# Patient Record
Sex: Female | Born: 1955 | Race: White | Hispanic: No | Marital: Single | Smoking: Never smoker
Health system: Southern US, Community
[De-identification: ages and names within clinical notes are randomized; demographics above are authoritative.]

## PROBLEM LIST (undated history)

## (undated) DIAGNOSIS — F329 Major depressive disorder, single episode, unspecified: Secondary | ICD-10-CM

## (undated) DIAGNOSIS — T7840XA Allergy, unspecified, initial encounter: Secondary | ICD-10-CM

## (undated) DIAGNOSIS — I10 Essential (primary) hypertension: Secondary | ICD-10-CM

## (undated) DIAGNOSIS — E785 Hyperlipidemia, unspecified: Secondary | ICD-10-CM

## (undated) DIAGNOSIS — B009 Herpesviral infection, unspecified: Secondary | ICD-10-CM

## (undated) DIAGNOSIS — Z87440 Personal history of urinary (tract) infections: Secondary | ICD-10-CM

## (undated) DIAGNOSIS — K635 Polyp of colon: Secondary | ICD-10-CM

## (undated) DIAGNOSIS — F32A Depression, unspecified: Secondary | ICD-10-CM

## (undated) HISTORY — DX: Personal history of urinary (tract) infections: Z87.440

## (undated) HISTORY — DX: Allergy, unspecified, initial encounter: T78.40XA

## (undated) HISTORY — DX: Major depressive disorder, single episode, unspecified: F32.9

## (undated) HISTORY — PX: OOPHORECTOMY: SHX86

## (undated) HISTORY — DX: Hyperlipidemia, unspecified: E78.5

## (undated) HISTORY — DX: Polyp of colon: K63.5

## (undated) HISTORY — PX: ABDOMINAL HYSTERECTOMY: SHX81

## (undated) HISTORY — DX: Essential (primary) hypertension: I10

## (undated) HISTORY — DX: Depression, unspecified: F32.A

## (undated) HISTORY — DX: Herpesviral infection, unspecified: B00.9

---
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
## 1996-08-15 HISTORY — PX: BACK SURGERY: SHX140

## 2011-07-19 DIAGNOSIS — I10 Essential (primary) hypertension: Secondary | ICD-10-CM | POA: Insufficient documentation

## 2013-02-26 ENCOUNTER — Ambulatory Visit (INDEPENDENT_AMBULATORY_CARE_PROVIDER_SITE_OTHER): Payer: BC Managed Care – HMO | Admitting: Internal Medicine

## 2013-02-26 ENCOUNTER — Encounter: Payer: Self-pay | Admitting: Internal Medicine

## 2013-02-26 VITALS — BP 110/70 | HR 78 | Temp 98.8°F | Ht 62.5 in | Wt 125.2 lb

## 2013-02-26 DIAGNOSIS — Z9109 Other allergy status, other than to drugs and biological substances: Secondary | ICD-10-CM

## 2013-02-26 DIAGNOSIS — F3289 Other specified depressive episodes: Secondary | ICD-10-CM

## 2013-02-26 DIAGNOSIS — Z86010 Personal history of colonic polyps: Secondary | ICD-10-CM

## 2013-02-26 DIAGNOSIS — F329 Major depressive disorder, single episode, unspecified: Secondary | ICD-10-CM

## 2013-02-26 DIAGNOSIS — F32A Depression, unspecified: Secondary | ICD-10-CM

## 2013-02-26 DIAGNOSIS — E7800 Pure hypercholesterolemia, unspecified: Secondary | ICD-10-CM

## 2013-02-26 DIAGNOSIS — I10 Essential (primary) hypertension: Secondary | ICD-10-CM

## 2013-03-02 ENCOUNTER — Encounter: Payer: Self-pay | Admitting: Internal Medicine

## 2013-03-02 DIAGNOSIS — Z860100 Personal history of colon polyps, unspecified: Secondary | ICD-10-CM | POA: Insufficient documentation

## 2013-03-02 DIAGNOSIS — I10 Essential (primary) hypertension: Secondary | ICD-10-CM | POA: Insufficient documentation

## 2013-03-02 DIAGNOSIS — F32A Depression, unspecified: Secondary | ICD-10-CM | POA: Insufficient documentation

## 2013-03-02 DIAGNOSIS — Z9109 Other allergy status, other than to drugs and biological substances: Secondary | ICD-10-CM | POA: Insufficient documentation

## 2013-03-02 DIAGNOSIS — E7800 Pure hypercholesterolemia, unspecified: Secondary | ICD-10-CM | POA: Insufficient documentation

## 2013-03-02 DIAGNOSIS — F329 Major depressive disorder, single episode, unspecified: Secondary | ICD-10-CM | POA: Insufficient documentation

## 2013-03-02 NOTE — Assessment & Plan Note (Signed)
Low cholesterol diet and exercise.  Follow lipid panel.   

## 2013-03-02 NOTE — Assessment & Plan Note (Signed)
Stable

## 2013-03-02 NOTE — Progress Notes (Signed)
  Subjective:    Patient ID: Brooke Gallagher, female    DOB: 22-May-1956, 57 y.o.   MRN: [id number]  HPI 57 year old female with past history of hypertension, hypercholesterolemia, depression and colonic polyps.  She comes in today to follow up on these issues as well as to establish care.  Has been seeing Dr Talmage Nap.  She has had some issues with increased blood pressure.  She has changed jobs.  Her blood pressure improved.  Previously on blood pressure medication.  Has been off since 3/14.  Blood pressures averaging 110-124/70-85.  Has also had a history of slightly increased cholesterol.  On no medication.  Was hospitalized in 2007 for depression.  Is followed by Dr Kristine Linea.  Is doing better now.  Controlled on trazodone and melatonin now.  Sees her every six months.  She is eating and drinking well.  Breathing stable.  Does report she has some bladder issues.  Some increased frequency.  No dysuria.  Does exercise regularly 3-5d/week.  Bowels stable.    Past Medical History  Diagnosis Date  . Depression   . Allergy   . Hyperlipidemia   . Hypertension   . Hx: UTI (urinary tract infection)   . Herpes   . Colon polyp     Outpatient Encounter Prescriptions as of 02/26/2013  Medication Sig Dispense Refill  . ibuprofen (ADVIL,MOTRIN) 100 MG tablet Take 100 mg by mouth every 6 (six) hours as needed for fever.      . loratadine (CLARITIN) 10 MG tablet Take 10 mg by mouth as needed for allergies.      . Melatonin 10 MG TABS Take by mouth daily.      . Prenatal Vit-Fe Fumarate-FA (M-VIT) tablet Take 1 tablet by mouth daily. Adult Formula Complete 50+      . traZODone (DESYREL) 100 MG tablet Take 100 mg by mouth at bedtime.      . valACYclovir (VALTREX) 500 MG tablet Take 500 mg by mouth as needed.       No facility-administered encounter medications on file as of 02/26/2013.    Review of Systems Patient denies any headache, lightheadedness or dizziness. No sinus or allergy symptoms.   No chest pain, tightness or palpitations.  No increased shortness of breath, cough or congestion.  No nausea or vomiting.  No acid reflux.  No abdominal pain or cramping.  No bowel change, such as diarrhea, constipation, BRBPR or melana.  Some urinary frequency.  No dysuria.  Depression is better.  Still follows by Dr Vanessa Ralphs.  Blood pressure as outlined.  Overall feels things are stable.         Objective:   Physical Exam Filed Vitals:   02/26/13 1001  BP: 110/70  Pulse: 78  Temp: 98.8 F (109.36 C)   57 year old female in no acute distress.   HEENT:  Nares- clear.  Oropharynx - without lesions. NECK:  Supple.  Nontender.  No audible bruit.  HEART:  Appears to be regular. LUNGS:  No crackles or wheezing audible.  Respirations even and unlabored.  RADIAL PULSE:  Equal bilaterally.   ABDOMEN:  Soft, nontender.  Bowel sounds present and normal.  No audible abdominal bruit.  EXTREMITIES:  No increased edema present.  DP pulses palpable and equal bilaterally.          Assessment & Plan:  HEALTH MAINTENANCE.  Schedule a physical next visit.  Schedule mammogram.  States last colonoscopy 2009.

## 2013-03-02 NOTE — Assessment & Plan Note (Signed)
Had a colonoscopy (per her report) 2009.  Recommended f/u in five years.

## 2013-03-02 NOTE — Assessment & Plan Note (Signed)
Stable currently.  On trazodone and melatonin.  Continue follow up with her psychiatrist.  Follow.

## 2013-03-02 NOTE — Assessment & Plan Note (Signed)
Blood pressure is doing well on no medication.  Follow.   

## 2013-03-21 ENCOUNTER — Ambulatory Visit: Payer: Self-pay | Admitting: Internal Medicine

## 2013-03-21 LAB — HM MAMMOGRAPHY

## 2013-03-26 ENCOUNTER — Encounter: Payer: Self-pay | Admitting: Internal Medicine

## 2013-04-03 ENCOUNTER — Encounter: Payer: Self-pay | Admitting: Internal Medicine

## 2013-05-03 ENCOUNTER — Encounter: Payer: Self-pay | Admitting: Internal Medicine

## 2013-05-03 ENCOUNTER — Other Ambulatory Visit (HOSPITAL_COMMUNITY)
Admission: RE | Admit: 2013-05-03 | Discharge: 2013-05-03 | Disposition: A | Discharge status: Home / Self Care | Payer: BC Managed Care – HMO | Source: Ambulatory Visit | Attending: Internal Medicine | Admitting: Internal Medicine

## 2013-05-03 ENCOUNTER — Ambulatory Visit (INDEPENDENT_AMBULATORY_CARE_PROVIDER_SITE_OTHER): Payer: BC Managed Care – HMO | Admitting: Internal Medicine

## 2013-05-03 VITALS — BP 122/82 | HR 69 | Temp 98.0°F | Ht 62.5 in | Wt 123.5 lb

## 2013-05-03 DIAGNOSIS — I10 Essential (primary) hypertension: Secondary | ICD-10-CM

## 2013-05-03 DIAGNOSIS — F329 Major depressive disorder, single episode, unspecified: Secondary | ICD-10-CM

## 2013-05-03 DIAGNOSIS — R87810 Cervical high risk human papillomavirus (HPV) DNA test positive: Secondary | ICD-10-CM | POA: Insufficient documentation

## 2013-05-03 DIAGNOSIS — Z1151 Encounter for screening for human papillomavirus (HPV): Secondary | ICD-10-CM | POA: Insufficient documentation

## 2013-05-03 DIAGNOSIS — Z86010 Personal history of colonic polyps: Secondary | ICD-10-CM

## 2013-05-03 DIAGNOSIS — Z124 Encounter for screening for malignant neoplasm of cervix: Secondary | ICD-10-CM

## 2013-05-03 DIAGNOSIS — F32A Depression, unspecified: Secondary | ICD-10-CM

## 2013-05-03 DIAGNOSIS — Z9109 Other allergy status, other than to drugs and biological substances: Secondary | ICD-10-CM

## 2013-05-03 DIAGNOSIS — E7800 Pure hypercholesterolemia, unspecified: Secondary | ICD-10-CM

## 2013-05-03 DIAGNOSIS — F3289 Other specified depressive episodes: Secondary | ICD-10-CM

## 2013-05-03 DIAGNOSIS — Z01419 Encounter for gynecological examination (general) (routine) without abnormal findings: Secondary | ICD-10-CM | POA: Insufficient documentation

## 2013-05-03 LAB — COMPREHENSIVE METABOLIC PANEL
ALT: 16 U/L (ref 0–35)
CO2: 28 mEq/L (ref 19–32)
Calcium: 9.3 mg/dL (ref 8.4–10.5)
Chloride: 104 mEq/L (ref 96–112)
GFR: 89.42 mL/min (ref >60.00)
Sodium: 138 mEq/L (ref 135–145)
Total Bilirubin: 0.8 mg/dL (ref 0.3–1.2)
Total Protein: 7.4 g/dL (ref 6.0–8.3)

## 2013-05-03 LAB — CBC WITH DIFFERENTIAL/PLATELET
Basophils Absolute: 0 10*3/uL (ref 0.0–0.1)
Lymphocytes Relative: 39.1 % (ref 12.0–46.0)
Monocytes Relative: 7.3 % (ref 3.0–12.0)
Platelets: 272 10*3/uL (ref 150.0–400.0)
RDW: 13.2 % (ref 11.5–14.6)

## 2013-05-03 LAB — LIPID PANEL: HDL: 44.6 mg/dL (ref >39.00)

## 2013-05-03 LAB — LDL CHOLESTEROL, DIRECT: Direct LDL: 146.7 mg/dL

## 2013-05-03 MED ORDER — VALACYCLOVIR HCL 500 MG PO TABS: 500.0000 mg | ORAL_TABLET | ORAL | Status: DC | PRN

## 2013-05-05 ENCOUNTER — Encounter: Payer: Self-pay | Admitting: Internal Medicine

## 2013-05-05 NOTE — Assessment & Plan Note (Signed)
Low cholesterol diet and exercise.  Follow lipid panel.   

## 2013-05-05 NOTE — Assessment & Plan Note (Addendum)
Last colonoscopy 2009.  Had two polyps.  Refer to GI for f/u colonoscopy.

## 2013-05-05 NOTE — Assessment & Plan Note (Signed)
Stable.  Follow.   

## 2013-05-05 NOTE — Progress Notes (Signed)
  Subjective:    Patient ID: Brooke Gallagher, female    DOB: 12/26/1955, 57 y.o.   MRN: [id number]  HPI 57 year old female with past history of hypertension, hypercholesterolemia, depression and colonic polyps.  She comes in today to follow up on these issues as well as for a complete physical exam.   She has had some issues with increased blood pressure.  She has changed jobs.  Her blood pressure improved.  Has also had a history of slightly increased cholesterol.  On no medication.  Was hospitalized in 2007 for depression.  Is followed by Dr Kristine Linea.  Is doing better now.  Controlled on trazodone and melatonin now.  Sees her every six months.  She is eating and drinking well.  Breathing stable.  Does exercise regularly 3-5d/week.  Reports had some stomach cramps two weeks ago.  Hard bowel movements.  Resolved now.     Past Medical History  Diagnosis Date  . Depression   . Allergy   . Hyperlipidemia   . Hypertension   . Hx: UTI (urinary tract infection)   . Herpes   . Colon polyp     Outpatient Encounter Prescriptions as of 05/03/2013  Medication Sig Dispense Refill  . ibuprofen (ADVIL,MOTRIN) 100 MG tablet Take 100 mg by mouth every 6 (six) hours as needed for fever.      . loratadine (CLARITIN) 10 MG tablet Take 10 mg by mouth as needed for allergies.      . Melatonin 10 MG TABS Take by mouth daily.      . Prenatal Vit-Fe Fumarate-FA (M-VIT) tablet Take 1 tablet by mouth daily. Adult Formula Complete 50+      . traZODone (DESYREL) 100 MG tablet Take 100 mg by mouth at bedtime.      . valACYclovir (VALTREX) 500 MG tablet Take 1 tablet (500 mg total) by mouth as needed.  30 tablet  1  . [DISCONTINUED] valACYclovir (VALTREX) 500 MG tablet Take 500 mg by mouth as needed.       No facility-administered encounter medications on file as of 05/03/2013.    Review of Systems Patient denies any headache, lightheadedness or dizziness. No sinus or allergy symptoms.  No chest pain, tightness or  palpitations.  No increased shortness of breath, cough or congestion.  No nausea or vomiting.  No acid reflux.  No abdominal pain or cramping.  No bowel change currently.  No diarrhea, constipation, BRBPR or melana.   Depression is better.  Still follows by Dr Vanessa Ralphs.  Blood pressure as outlined.  Overall feels things are stable.         Objective:   Physical Exam  Filed Vitals:   05/03/13 0901  BP: 122/82  Pulse: 69  Temp: 98 F (18.56 C)   57 year old female in no acute distress.   HEENT:  Nares- clear.  Oropharynx - without lesions. NECK:  Supple.  Nontender.  No audible bruit.  HEART:  Appears to be regular. LUNGS:  No crackles or wheezing audible.  Respirations even and unlabored.  RADIAL PULSE:  Equal bilaterally.   ABDOMEN:  Soft, nontender.  Bowel sounds present and normal.  No audible abdominal bruit.  EXTREMITIES:  No increased edema present.  DP pulses palpable and equal bilaterally.          Assessment & Plan:  HEALTH MAINTENANCE.  Physical today.   States last colonoscopy 2009.  Mammogram 03/21/13 - Birads II.

## 2013-05-05 NOTE — Assessment & Plan Note (Signed)
Blood pressure is doing well on no medication.  Follow.   

## 2013-05-05 NOTE — Assessment & Plan Note (Signed)
Stable

## 2013-05-06 ENCOUNTER — Encounter: Payer: Self-pay | Admitting: *Deleted

## 2013-05-08 ENCOUNTER — Other Ambulatory Visit: Payer: Self-pay | Admitting: Internal Medicine

## 2013-05-08 DIAGNOSIS — IMO0002 Reserved for concepts with insufficient information to code with codable children: Secondary | ICD-10-CM

## 2013-05-08 NOTE — Progress Notes (Signed)
Order placed for referral to gyn for abnormal pap smear.

## 2013-10-18 ENCOUNTER — Ambulatory Visit: Payer: BC Managed Care – HMO | Admitting: Internal Medicine

## 2014-03-26 ENCOUNTER — Encounter: Payer: Self-pay | Admitting: *Deleted

## 2014-03-26 ENCOUNTER — Ambulatory Visit: Payer: Self-pay | Admitting: Internal Medicine

## 2014-03-26 LAB — HM MAMMOGRAPHY: HM Mammogram: NEGATIVE

## 2014-04-11 ENCOUNTER — Encounter: Payer: Self-pay | Admitting: Internal Medicine

## 2015-12-07 ENCOUNTER — Encounter: Payer: Self-pay | Admitting: *Deleted

## 2015-12-11 ENCOUNTER — Encounter: Payer: Self-pay | Admitting: Obstetrics and Gynecology

## 2016-02-10 ENCOUNTER — Ambulatory Visit (INDEPENDENT_AMBULATORY_CARE_PROVIDER_SITE_OTHER): Payer: BLUE CROSS/BLUE SHIELD | Admitting: Obstetrics and Gynecology

## 2016-02-10 ENCOUNTER — Encounter: Payer: Self-pay | Admitting: Obstetrics and Gynecology

## 2016-02-10 VITALS — BP 128/94 | HR 78 | Ht 63.0 in | Wt 143.5 lb

## 2016-02-10 DIAGNOSIS — R635 Abnormal weight gain: Secondary | ICD-10-CM | POA: Diagnosis not present

## 2016-02-10 DIAGNOSIS — Z01419 Encounter for gynecological examination (general) (routine) without abnormal findings: Secondary | ICD-10-CM

## 2016-02-10 NOTE — Patient Instructions (Signed)
  Place annual gynecologic exam patient instructions here.  Thank you for enrolling in [facility]. Please follow the instructions below to securely access your online medical record. MyChart allows you to send messages to your doctor, view your test results, manage appointments, and more.   How Do I Sign Up? 1. In your Internet browser, go to [employer] and enter [email or web address]. 2. Click on the Sign Up Now link in the Sign In box. You will see the New Member Sign Up page. 3. Enter your MyChart Access Code exactly as it appears below. You will not need to use this code after you've completed the sign-up process. If you do not sign up before the expiration date, you must request a new code.  MyChart Access Code: [email or web address] Expires: 04/10/2016  9:10 AM  4. Enter your Social Security Number ([id number]) and Date of Birth (mm/dd/yyyy) as indicated and click Submit. You will be taken to the next sign-up page. 5. Create a MyChart ID. This will be your MyChart login ID and cannot be changed, so think of one that is secure and easy to remember. 6. Create a MyChart password. You can change your password at any time. 7. Enter your Password Reset Question and Answer. This can be used at a later time if you forget your password.  8. Enter your e-mail address. You will receive e-mail notification when new information is available in [place]. 9. Click Sign Up. You can now view your medical record.   Additional Information Remember, MyChart is NOT to be used for urgent needs. For medical emergencies, dial 911.

## 2016-02-10 NOTE — Progress Notes (Signed)
Subjective:   Brooke Gallagher is a 60 y.o. G2P0 Caucasian female here for a routine well-woman exam.  No LMP recorded. Patient is postmenopausal.    Current complaints: weight gain-slow with no real change in activity and eating habits PCP: C. 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  Past Medical History Past Medical History  Diagnosis Date  . Depression   . Allergy   . Hyperlipidemia   . Hypertension   . Hx: UTI (urinary tract infection)   . Herpes   . Colon polyp     Past Surgical History Past Surgical History  Procedure Laterality Date  . Tonsillectomy  1963  . Back surgery  1998    lower back    Gynecologic History G2P0  No LMP recorded. Patient is postmenopausal. Contraception: abstinence Last Pap: 2016. Results were: normal Last mammogram: 2016. Results were: normal   Obstetric History OB History  Gravida Para Term Preterm AB SAB TAB Ectopic Multiple Living  2         2    # Outcome Date GA Lbr Len/2nd Weight Sex Delivery Anes PTL Lv  2 Gravida      Vag-Spont   Y  1 Gravida      Vag-Spont   Y      Current Medications Current Outpatient Prescriptions on File Prior to Visit  Medication Sig Dispense Refill  . ibuprofen (ADVIL,MOTRIN) 100 MG tablet Take 100 mg by mouth every 6 (six) hours as needed for fever.    . loratadine (CLARITIN) 10 MG tablet Take 10 mg by mouth as needed for allergies.    . Melatonin 10 MG TABS Take by mouth daily.    . Prenatal Vit-Fe Fumarate-FA (M-VIT) tablet Take 1 tablet by mouth daily. Reported on 02/10/2016     No current  facility-administered medications on file prior to visit.    Review of Systems Patient denies any headaches, blurred vision, shortness of breath, chest pain, abdominal pain, problems with bowel movements, urination, or intercourse.  Objective:  BP 128/94 mmHg  Pulse 78  Ht 5\' 3"  (1.6 m)  Wt 143 lb 8 oz (65.091 kg)  BMI 25.43 kg/m2 Physical Exam  General:  Well developed, well nourished, no acute distress. She is alert and oriented x3. Skin:  Warm and dry Neck:  Midline trachea, no thyromegaly or nodules Cardiovascular: Regular rate and rhythm, no murmur heard Lungs:  Effort normal, all lung fields clear to auscultation bilaterally Breasts:  No dominant palpable mass, retraction, or nipple discharge Abdomen:  Soft, non tender, no hepatosplenomegaly or masses Pelvic:  External genitalia is atrophic in appearance.  The vagina is normal and atrophic in appearance. The cervix is bulbous, no CMT.  Thin prep pap is not done . Uterus is felt to be normal size, shape, and contour.  No adnexal masses or tenderness noted.  Extremities:  No swelling or varicosities noted Psych:  She has a normal mood and affect  Assessment:   Healthy well-woman exam htn Weigh gain  Plan:  Labs obtained, discussed changes in exercise and nutrition, water intake To check BP at home and if diastolic  stays over 90 to call to restart BP meds. F/U 1 year for AE, or sooner if needed Mammogram scheduled.  Landen Breeland Rockney Ghee, CNM

## 2016-02-11 LAB — COMPREHENSIVE METABOLIC PANEL
ALT: 22 IU/L (ref 0–32)
AST: 23 IU/L (ref 0–40)
Albumin/Globulin Ratio: 1.8 (ref 1.2–2.2)
Albumin: 4.4 g/dL (ref 3.6–4.8)
Alkaline Phosphatase: 57 IU/L (ref 39–117)
BUN/Creatinine Ratio: 18 (ref 12–28)
BUN: 12 mg/dL (ref 8–27)
Bilirubin Total: 0.3 mg/dL (ref 0.0–1.2)
CALCIUM: 9.6 mg/dL (ref 8.7–10.3)
CO2: 25 mmol/L (ref 18–29)
CREATININE: 0.66 mg/dL (ref 0.57–1.00)
Chloride: 101 mmol/L (ref 96–106)
GFR, EST AFRICAN AMERICAN: 109 mL/min/{1.73_m2} (ref >59)
GFR, EST NON AFRICAN AMERICAN: 95 mL/min/{1.73_m2} (ref >59)
GLOBULIN, TOTAL: 2.4 g/dL (ref 1.5–4.5)
Glucose: 71 mg/dL (ref 65–99)
Potassium: 4.2 mmol/L (ref 3.5–5.2)
SODIUM: 141 mmol/L (ref 134–144)
TOTAL PROTEIN: 6.8 g/dL (ref 6.0–8.5)

## 2016-02-11 LAB — TSH: TSH: 3.84 u[IU]/mL (ref 0.450–4.500)

## 2016-02-11 LAB — LIPID PANEL
CHOLESTEROL TOTAL: 177 mg/dL (ref 100–199)
Chol/HDL Ratio: 4.4 ratio units (ref 0.0–4.4)
HDL: 40 mg/dL (ref >39)
LDL CALC: 110 mg/dL — AB (ref 0–99)
Triglycerides: 136 mg/dL (ref 0–149)
VLDL Cholesterol Cal: 27 mg/dL (ref 5–40)

## 2016-02-11 LAB — VITAMIN D 25 HYDROXY (VIT D DEFICIENCY, FRACTURES): VIT D 25 HYDROXY: 33.2 ng/mL (ref 30.0–100.0)

## 2016-03-22 ENCOUNTER — Ambulatory Visit
Admission: RE | Admit: 2016-03-22 | Discharge: 2016-03-22 | Disposition: A | Discharge status: Home / Self Care | Payer: BLUE CROSS/BLUE SHIELD | Source: Ambulatory Visit | Attending: Obstetrics and Gynecology | Admitting: Obstetrics and Gynecology

## 2016-03-22 ENCOUNTER — Other Ambulatory Visit: Payer: Self-pay | Admitting: Obstetrics and Gynecology

## 2016-03-22 DIAGNOSIS — Z01419 Encounter for gynecological examination (general) (routine) without abnormal findings: Secondary | ICD-10-CM | POA: Diagnosis not present

## 2016-03-22 DIAGNOSIS — Z1231 Encounter for screening mammogram for malignant neoplasm of breast: Secondary | ICD-10-CM | POA: Diagnosis present

## 2016-03-29 ENCOUNTER — Telehealth: Payer: Self-pay | Admitting: *Deleted

## 2016-03-29 NOTE — Telephone Encounter (Signed)
Patient has not seen Dr. Nicki Reaper since 2014, is it okay to re-establish pt with Dr. Nicki Reaper

## 2016-03-30 NOTE — Telephone Encounter (Signed)
Pt wants to reestablish care.  I do not mind her reestablishing care.  Need to know if there was problem initially (why did she stop coming).

## 2016-03-30 NOTE — Telephone Encounter (Signed)
Patient can re-establish, when you schedule can you ask her if there was an issue prior?  Thanks

## 2016-03-30 NOTE — Telephone Encounter (Signed)
LVM for pt to call to schedule

## 2016-06-22 ENCOUNTER — Encounter: Payer: Self-pay | Admitting: Internal Medicine

## 2016-06-22 ENCOUNTER — Ambulatory Visit (INDEPENDENT_AMBULATORY_CARE_PROVIDER_SITE_OTHER): Payer: BLUE CROSS/BLUE SHIELD | Admitting: Internal Medicine

## 2016-06-22 VITALS — BP 140/90 | HR 78 | Temp 98.1°F | Ht 63.0 in | Wt 143.4 lb

## 2016-06-22 DIAGNOSIS — E7800 Pure hypercholesterolemia, unspecified: Secondary | ICD-10-CM | POA: Diagnosis not present

## 2016-06-22 DIAGNOSIS — Z86010 Personal history of colonic polyps: Secondary | ICD-10-CM

## 2016-06-22 DIAGNOSIS — R079 Chest pain, unspecified: Secondary | ICD-10-CM | POA: Diagnosis not present

## 2016-06-22 DIAGNOSIS — I10 Essential (primary) hypertension: Secondary | ICD-10-CM | POA: Diagnosis not present

## 2016-06-22 DIAGNOSIS — Z0000 Encounter for general adult medical examination without abnormal findings: Secondary | ICD-10-CM

## 2016-06-22 MED ORDER — LISINOPRIL 10 MG PO TABS: 10.0000 mg | ORAL_TABLET | Freq: Every day | ORAL | 1 refills | Status: DC

## 2016-06-22 NOTE — Assessment & Plan Note (Signed)
Blood pressure has been a little elevated.  Given persistent elevation, will start lisinopril 10mg  q day.  Will check metabolic panel in 123456 days to confirm kidney function stable.  Get her back in soon to reassess.

## 2016-06-22 NOTE — Progress Notes (Signed)
Pre visit review using our clinic review tool, if applicable. No additional management support is needed unless otherwise documented below in the visit note. 

## 2016-06-22 NOTE — Progress Notes (Signed)
Patient ID: Brooke Gallagher, female   DOB: [birth year], 60 y.o.   MRN: [id number]   Subjective:    Patient ID: Brooke Gallagher, female    DOB: [birth year], 60 y.o.   MRN: [id number]  HPI  Patient here for a scheduled follow up .  I have not seen her since 2014.  She is up to date with gyn exams.  Sees [facility].  Just evaluated in June.  Blood pressure elevated at that visit.  She has been monitoring her blood pressure.  Outside checks averaging 120-140s/80-90s.  See attached list.  She reports she had some chest pain recently.  Works as a Surveyor, minerals.  States she had fixed lunch and had eaten lunch.  Noticed a burning chest pain.  Had to sit down.  States the symptoms were intermittent (coming and going) for approximately two hours.  Reports no history of acid reflux.  No dysphagia.  Tries to stay active.  No nausea or vomiting.  Bowels stable.  Had not had reoccurrence.  Just had labs through gyn.  Discussed with her today.     Past Medical History:  Diagnosis Date  . Allergy   . Colon polyp   . Depression   . Herpes   . Hx: UTI (urinary tract infection)   . Hyperlipidemia   . Hypertension    Past Surgical History:  Procedure Laterality Date  . [place]   lower back  . TONSILLECTOMY  1963   Family History  Problem Relation Age of Onset  . Arthritis Mother   . Stroke Mother   . Hypertension Mother   . Alzheimer's disease Mother   . Colon cancer Father   . Prostate cancer Father   . Mental illness Sister   . Alcohol abuse Brother   . Colon cancer Paternal Uncle   . Colon cancer Maternal Grandmother   . Mental illness Maternal Grandmother   . Alzheimer's disease Maternal Grandmother   . Hyperlipidemia Maternal Grandfather   . Hypertension Maternal Grandfather   . Diabetes Maternal Grandfather   . Arthritis Paternal Grandmother   . Alcohol abuse Paternal Grandfather   . Breast cancer Neg Hx    Social History   Social History  . Marital status: Single    Spouse  name: N/A  . Number of children: N/A  . Years of education: N/A   Social History Main Topics  . Smoking status: Never Smoker  . Smokeless tobacco: Never Used  . Alcohol use Yes  . Drug use: No  . Sexual activity: Not Currently   Other Topics Concern  . None   Social History Narrative  . None    Outpatient Encounter Prescriptions as of 06/22/2016  Medication Sig  . ibuprofen (ADVIL,MOTRIN) 100 MG tablet Take 100 mg by mouth every 6 (six) hours as needed for fever.  . loratadine (CLARITIN) 10 MG tablet Take 10 mg by mouth as needed for allergies.  . Melatonin 10 MG TABS Take by mouth daily.  . Prenatal Vit-Fe Fumarate-FA (M-VIT) tablet Take 1 tablet by mouth daily. Reported on 02/10/2016  . lisinopril (PRINIVIL,ZESTRIL) 10 MG tablet Take 1 tablet (10 mg total) by mouth daily.   No facility-administered encounter medications on file as of 06/22/2016.     Review of Systems  Constitutional: Negative for appetite change and unexpected weight change.  HENT: Negative for congestion and sinus pressure.   Eyes: Negative for pain and visual disturbance.  Respiratory: Negative for cough, chest tightness and  shortness of breath.   Cardiovascular: Positive for chest pain. Negative for palpitations and leg swelling.  Gastrointestinal: Negative for abdominal pain, diarrhea, nausea and vomiting.  Genitourinary: Negative for difficulty urinating and dysuria.  Musculoskeletal: Negative for back pain and joint swelling.  Skin: Negative for color change and rash.  Neurological: Negative for dizziness, light-headedness and headaches.  Hematological: Negative for adenopathy. Does not bruise/bleed easily.  Psychiatric/Behavioral: Negative for agitation and dysphoric mood.       Objective:     Blood pressure rechecked by me:  136/88-90  Physical Exam  Constitutional: She appears well-developed and well-nourished. No distress.  HENT:  Nose: Nose normal.  Mouth/Throat: Oropharynx is clear and  moist.  Neck: Neck supple. No thyromegaly present.  Cardiovascular: Normal rate and regular rhythm.   Pulmonary/Chest: Breath sounds normal. No respiratory distress. She has no wheezes.  Abdominal: Soft. Bowel sounds are normal. There is no tenderness.  Musculoskeletal: She exhibits no edema or tenderness.  Lymphadenopathy:    She has no cervical adenopathy.  Skin: No rash noted. No erythema.  Psychiatric: She has a normal mood and affect. Her behavior is normal.    BP 140/90   Pulse 78   Temp 98.1 F (36.7 C) (Oral)   Ht 5\' 3"  (1.6 m)   Wt 143 lb 6.4 oz (65 kg)   SpO2 99%   BMI 25.40 kg/m  Wt Readings from Last 3 Encounters:  06/22/16 143 lb 6.4 oz (65 kg)  02/10/16 143 lb 8 oz (65.1 kg)  05/03/13 123 lb 8 oz (56 kg)     Lab Results  Component Value Date   WBC 4.3 (L) 05/03/2013   HGB 13.8 05/03/2013   HCT 40.8 05/03/2013   PLT 272.0 05/03/2013   GLUCOSE 71 02/10/2016   CHOL 177 02/10/2016   TRIG 136 02/10/2016   HDL 40 02/10/2016   LDLDIRECT 146.7 05/03/2013   LDLCALC 110 (H) 02/10/2016   ALT 22 02/10/2016   AST 23 02/10/2016   NA 141 02/10/2016   K 4.2 02/10/2016   CL 101 02/10/2016   CREATININE 0.66 02/10/2016   BUN 12 02/10/2016   CO2 25 02/10/2016   TSH 3.840 02/10/2016    Mm Screening Breast Tomo Bilateral  Result Date: 03/22/2016 CLINICAL DATA:  Screening. EXAM: 2D DIGITAL SCREENING BILATERAL MAMMOGRAM WITH CAD AND ADJUNCT TOMO COMPARISON:  Previous exam(s). ACR Breast Density Category b: There are scattered areas of fibroglandular density. FINDINGS: There are no findings suspicious for malignancy. Images were processed with CAD. IMPRESSION: No mammographic evidence of malignancy. A result letter of this screening mammogram will be mailed directly to the patient. RECOMMENDATION: Screening mammogram in one year. (Code:SM-B-01Y) BI-RADS CATEGORY  1: Negative. Electronically Signed   By: Altamese Cabal M.D.   On: 03/22/2016 12:41       Assessment &  Plan:   Problem List Items Addressed This Visit    Chest pain - Primary    Had the episode of chest pain as outlined.  Intermittent for approximately 2 hours.  EKG obtained and reveals SR with no acute ischemic changes.  Will treat blood pressure as outlined.  Given chest pain lasting intermittently for two hours, would like for cardiology to evaluate with question of need for any further testing or w/up.  She is in agreement.        Relevant Orders   EKG 12-Lead (Completed)   Ambulatory referral to Cardiology   Essential hypertension, benign    Blood pressure has been  a little elevated.  Given persistent elevation, will start lisinopril 10mg  q day.  Will check metabolic panel in 123456 days to confirm kidney function stable.  Get her back in soon to reassess.        Relevant Medications   lisinopril (PRINIVIL,ZESTRIL) 10 MG tablet   Other Relevant Orders   Basic metabolic panel   Healthcare maintenance    Her her physical through gyn 01/2016.  Mammogram 03/22/16 - Birads I.  Refer for colonoscopy as outlined.        History of colonic polyps    Last colonoscopy 2009.  Had polyps.  Refer to GI for evaluation for colonoscopy.        Relevant Orders   Ambulatory referral to Gastroenterology   Hypercholesterolemia    Had recent labs through gyn 01/2016.  LDL 110.  Continue low cholesterol diet and exercise.  Follow.       Relevant Medications   lisinopril (PRINIVIL,ZESTRIL) 10 MG tablet       Einar Pheasant, MD

## 2016-06-22 NOTE — Assessment & Plan Note (Signed)
Last colonoscopy 2009.  Had polyps.  Refer to GI for evaluation for colonoscopy.

## 2016-06-23 ENCOUNTER — Encounter: Payer: Self-pay | Admitting: Internal Medicine

## 2016-06-23 DIAGNOSIS — R079 Chest pain, unspecified: Secondary | ICD-10-CM | POA: Insufficient documentation

## 2016-06-23 DIAGNOSIS — Z0000 Encounter for general adult medical examination without abnormal findings: Secondary | ICD-10-CM | POA: Insufficient documentation

## 2016-06-23 NOTE — Assessment & Plan Note (Signed)
Had the episode of chest pain as outlined.  Intermittent for approximately 2 hours.  EKG obtained and reveals SR with no acute ischemic changes.  Will treat blood pressure as outlined.  Given chest pain lasting intermittently for two hours, would like for cardiology to evaluate with question of need for any further testing or w/up.  She is in agreement.

## 2016-06-23 NOTE — Assessment & Plan Note (Signed)
Her her physical through gyn 01/2016.  Mammogram 03/22/16 - Birads I.  Refer for colonoscopy as outlined.

## 2016-06-23 NOTE — Assessment & Plan Note (Signed)
Had recent labs through gyn 01/2016.  LDL 110.  Continue low cholesterol diet and exercise.  Follow.

## 2016-06-29 ENCOUNTER — Ambulatory Visit: Payer: BLUE CROSS/BLUE SHIELD | Admitting: Cardiology

## 2016-06-30 ENCOUNTER — Other Ambulatory Visit (INDEPENDENT_AMBULATORY_CARE_PROVIDER_SITE_OTHER): Payer: BLUE CROSS/BLUE SHIELD

## 2016-06-30 ENCOUNTER — Encounter: Payer: Self-pay | Admitting: Internal Medicine

## 2016-06-30 DIAGNOSIS — I10 Essential (primary) hypertension: Secondary | ICD-10-CM

## 2016-06-30 LAB — BASIC METABOLIC PANEL
BUN: 14 mg/dL (ref 6–23)
CALCIUM: 9.6 mg/dL (ref 8.4–10.5)
CHLORIDE: 106 meq/L (ref 96–112)
CO2: 28 mEq/L (ref 19–32)
CREATININE: 0.72 mg/dL (ref 0.40–1.20)
GFR: 87.07 mL/min (ref >60.00)
GLUCOSE: 67 mg/dL — AB (ref 70–99)
Potassium: 4 mEq/L (ref 3.5–5.1)
Sodium: 139 mEq/L (ref 135–145)

## 2016-07-01 ENCOUNTER — Encounter: Payer: Self-pay | Admitting: Internal Medicine

## 2016-08-30 ENCOUNTER — Ambulatory Visit (INDEPENDENT_AMBULATORY_CARE_PROVIDER_SITE_OTHER): Payer: BLUE CROSS/BLUE SHIELD | Admitting: Internal Medicine

## 2016-08-30 ENCOUNTER — Encounter: Payer: Self-pay | Admitting: Internal Medicine

## 2016-08-30 DIAGNOSIS — I10 Essential (primary) hypertension: Secondary | ICD-10-CM | POA: Diagnosis not present

## 2016-08-30 DIAGNOSIS — Z86010 Personal history of colonic polyps: Secondary | ICD-10-CM

## 2016-08-30 DIAGNOSIS — E7800 Pure hypercholesterolemia, unspecified: Secondary | ICD-10-CM | POA: Diagnosis not present

## 2016-08-30 DIAGNOSIS — R079 Chest pain, unspecified: Secondary | ICD-10-CM

## 2016-08-30 NOTE — Assessment & Plan Note (Signed)
LDL on last check 110.  She is adjusting her diet and exercising.  Follow lipid panel.

## 2016-08-30 NOTE — Patient Instructions (Signed)
Remain off lisinopril.  Follow blood pressures.  Send in readings over the next few weeks.

## 2016-08-30 NOTE — Assessment & Plan Note (Signed)
Blood pressure doing better.  Outside readings reviewed.  She has been off her lisinopril for the last couple of days.  Blood pressure looks good.  She has adjusted her diet.  Is exercising.  Feels good.  Will remain off.  Follow pressures.  Send in readings over the next few weeks.

## 2016-08-30 NOTE — Progress Notes (Signed)
Patient ID: Brooke Gallagher, female   DOB: Oct 24, 1955, 61 y.o.   MRN: [id number]   Subjective:    Patient ID: Brooke Gallagher, female    DOB: 11-24-1955, 62 y.o.   MRN: [id number]  HPI  Patient here for a scheduled follow up.  She is feeling better.  Last visit, we placed her on lisinopril for elevated blood pressure.  She has adjusted her diet.  Is exercising.  Blood pressure is better.  Overall trending down.  See attached list.  She cancelled her appt with cardiology.  States she did not feel she needed.  The previous episode occurred after eating a food she usually does not eat.  Has had no other episodes.  No chest pain or sob with exercise.  Feels better with exercise.  Desires no further testing.  No abdominal pain.  No nausea or vomiting.  Bowels stable.     Past Medical History:  Diagnosis Date  . Allergy   . Colon polyp   . Depression   . Herpes   . Hx: UTI (urinary tract infection)   . Hyperlipidemia   . Hypertension    Past Surgical History:  Procedure Laterality Date  . [place]   lower back  . TONSILLECTOMY  1963   Family History  Problem Relation Age of Onset  . Arthritis Mother   . Stroke Mother   . Hypertension Mother   . Alzheimer's disease Mother   . Colon cancer Father   . Prostate cancer Father   . Mental illness Sister   . Alcohol abuse Brother   . Colon cancer Paternal Uncle   . Colon cancer Maternal Grandmother   . Mental illness Maternal Grandmother   . Alzheimer's disease Maternal Grandmother   . Hyperlipidemia Maternal Grandfather   . Hypertension Maternal Grandfather   . Diabetes Maternal Grandfather   . Arthritis Paternal Grandmother   . Alcohol abuse Paternal Grandfather   . Breast cancer Neg Hx    Social History   Social History  . Marital status: Single    Spouse name: N/A  . Number of children: N/A  . Years of education: N/A   Social History Main Topics  . Smoking status: Never Smoker  . Smokeless tobacco: Never Used    . Alcohol use Yes  . Drug use: No  . Sexual activity: Not Currently   Other Topics Concern  . None   Social History Narrative  . None    Outpatient Encounter Prescriptions as of 08/30/2016  Medication Sig  . ibuprofen (ADVIL,MOTRIN) 100 MG tablet Take 100 mg by mouth every 6 (six) hours as needed for fever.  . loratadine (CLARITIN) 10 MG tablet Take 10 mg by mouth as needed for allergies.  . Melatonin 10 MG TABS Take by mouth daily.  . Multiple Vitamin (MULTIVITAMIN) tablet Take 1 tablet by mouth daily.  . [DISCONTINUED] lisinopril (PRINIVIL,ZESTRIL) 10 MG tablet Take 1 tablet (10 mg total) by mouth daily.  . [DISCONTINUED] Prenatal Vit-Fe Fumarate-FA (M-VIT) tablet Take 1 tablet by mouth daily. Reported on 02/10/2016   No facility-administered encounter medications on file as of 08/30/2016.     Review of Systems  Constitutional: Negative for appetite change and unexpected weight change.  HENT: Negative for congestion and sinus pressure.   Respiratory: Negative for cough, chest tightness and shortness of breath.   Cardiovascular: Negative for chest pain, palpitations and leg swelling.  Gastrointestinal: Negative for abdominal pain, diarrhea, nausea and vomiting.  Genitourinary: Negative  for difficulty urinating and dysuria.  Musculoskeletal: Negative for back pain and joint swelling.  Skin: Negative for color change and rash.  Neurological: Negative for dizziness, light-headedness and headaches.  Psychiatric/Behavioral: Negative for agitation and dysphoric mood.       Objective:     Blood pressure rechecked by me:  110/72  Physical Exam  Constitutional: She appears well-developed and well-nourished. No distress.  HENT:  Nose: Nose normal.  Mouth/Throat: Oropharynx is clear and moist.  Neck: Neck supple. No thyromegaly present.  Cardiovascular: Normal rate and regular rhythm.   Pulmonary/Chest: Breath sounds normal. No respiratory distress. She has no wheezes.   Abdominal: Soft. Bowel sounds are normal. There is no tenderness.  Musculoskeletal: She exhibits no edema or tenderness.  Lymphadenopathy:    She has no cervical adenopathy.  Skin: No rash noted. No erythema.  Psychiatric: She has a normal mood and affect. Her behavior is normal.    BP 124/78   Pulse 82   Temp 97.5 F (36.4 C) (Oral)   Ht 5\' 3"  (1.6 m)   Wt 143 lb 6.4 oz (65 kg)   SpO2 97%   BMI 25.40 kg/m  Wt Readings from Last 3 Encounters:  08/30/16 143 lb 6.4 oz (65 kg)  06/22/16 143 lb 6.4 oz (65 kg)  02/10/16 143 lb 8 oz (65.1 kg)     Lab Results  Component Value Date   WBC 4.3 (L) 05/03/2013   HGB 13.8 05/03/2013   HCT 40.8 05/03/2013   PLT 272.0 05/03/2013   GLUCOSE 67 (L) 06/30/2016   CHOL 177 02/10/2016   TRIG 136 02/10/2016   HDL 40 02/10/2016   LDLDIRECT 146.7 05/03/2013   LDLCALC 110 (H) 02/10/2016   ALT 22 02/10/2016   AST 23 02/10/2016   NA 139 06/30/2016   K 4.0 06/30/2016   CL 106 06/30/2016   CREATININE 0.72 06/30/2016   BUN 14 06/30/2016   CO2 28 06/30/2016   TSH 3.840 02/10/2016    Mm Screening Breast Tomo Bilateral  Result Date: 03/22/2016 CLINICAL DATA:  Screening. EXAM: 2D DIGITAL SCREENING BILATERAL MAMMOGRAM WITH CAD AND ADJUNCT TOMO COMPARISON:  Previous exam(s). ACR Breast Density Category b: There are scattered areas of fibroglandular density. FINDINGS: There are no findings suspicious for malignancy. Images were processed with CAD. IMPRESSION: No mammographic evidence of malignancy. A result letter of this screening mammogram will be mailed directly to the patient. RECOMMENDATION: Screening mammogram in one year. (Code:SM-B-01Y) BI-RADS CATEGORY  1: Negative. Electronically Signed   By: Altamese Cabal M.D.   On: 03/22/2016 12:41       Assessment & Plan:   Problem List Items Addressed This Visit    Chest pain    No chest pain since last visit.  The previous episode occurred when she ate a different food.  Is exercising and no  pain.  Feels better with exercise.  She declines any further w/up at this time.  Follow.       Essential hypertension, benign    Blood pressure doing better.  Outside readings reviewed.  She has been off her lisinopril for the last couple of days.  Blood pressure looks good.  She has adjusted her diet.  Is exercising.  Feels good.  Will remain off.  Follow pressures.  Send in readings over the next few weeks.        History of colonic polyps    Last colonoscopy 2009.  Scheduled to see GI.  Hypercholesterolemia    LDL on last check 110.  She is adjusting her diet and exercising.  Follow lipid panel.            Einar Pheasant, MD

## 2016-08-30 NOTE — Assessment & Plan Note (Signed)
No chest pain since last visit.  The previous episode occurred when she ate a different food.  Is exercising and no pain.  Feels better with exercise.  She declines any further w/up at this time.  Follow.

## 2016-08-30 NOTE — Assessment & Plan Note (Signed)
Last colonoscopy 2009.  Scheduled to see GI.

## 2016-08-30 NOTE — Progress Notes (Signed)
Pre visit review using our clinic review tool, if applicable. No additional management support is needed unless otherwise documented below in the visit note. 

## 2016-10-13 DIAGNOSIS — K5730 Diverticulosis of large intestine without perforation or abscess without bleeding: Secondary | ICD-10-CM | POA: Insufficient documentation

## 2016-10-19 ENCOUNTER — Encounter: Payer: Self-pay | Admitting: Internal Medicine

## 2017-02-21 ENCOUNTER — Other Ambulatory Visit: Payer: Self-pay | Admitting: Obstetrics and Gynecology

## 2017-02-21 ENCOUNTER — Encounter: Payer: Self-pay | Admitting: Obstetrics and Gynecology

## 2017-02-21 ENCOUNTER — Ambulatory Visit (INDEPENDENT_AMBULATORY_CARE_PROVIDER_SITE_OTHER): Payer: BLUE CROSS/BLUE SHIELD | Admitting: Obstetrics and Gynecology

## 2017-02-21 VITALS — BP 136/94 | HR 82 | Ht 63.0 in | Wt 144.2 lb

## 2017-02-21 DIAGNOSIS — N841 Polyp of cervix uteri: Secondary | ICD-10-CM | POA: Diagnosis not present

## 2017-02-21 DIAGNOSIS — Z01411 Encounter for gynecological examination (general) (routine) with abnormal findings: Secondary | ICD-10-CM | POA: Diagnosis not present

## 2017-02-21 DIAGNOSIS — K5790 Diverticulosis of intestine, part unspecified, without perforation or abscess without bleeding: Secondary | ICD-10-CM

## 2017-02-21 NOTE — Patient Instructions (Signed)
Diverticulosis  Diverticulosis is a condition that develops when small pouches (diverticula) form in the wall of the large intestine (colon). The colon is where water is absorbed and stool is formed. The pouches form when the inside layer of the colon pushes through weak spots in the outer layers of the colon. You may have a few pouches or many of them.  What are the causes?  The cause of this condition is not known.  What increases the risk?  The following factors may make you more likely to develop this condition:   Being older than age 60. Your risk for this condition increases with age. Diverticulosis is rare among people younger than age 30. By age 80, many people have it.   Eating a low-fiber diet.   Having frequent constipation.   Being overweight.   Not getting enough exercise.   Smoking.   Taking over-the-counter pain medicines, like aspirin and ibuprofen.   Having a family history of diverticulosis.    What are the signs or symptoms?  In most people, there are no symptoms of this condition. If you do have symptoms, they may include:   Bloating.   Cramps in the abdomen.   Constipation or diarrhea.   Pain in the lower left side of the abdomen.    How is this diagnosed?  This condition is most often diagnosed during an exam for other colon problems. Because diverticulosis usually has no symptoms, it often cannot be diagnosed independently. This condition may be diagnosed by:   Using a flexible scope to examine the colon (colonoscopy).   Taking an X-ray of the colon after dye has been put into the colon (barium enema).   Doing a CT scan.    How is this treated?  You may not need treatment for this condition if you have never developed an infection related to diverticulosis. If you have had an infection before, treatment may include:   Eating a high-fiber diet. This may include eating more fruits, vegetables, and grains.   Taking a fiber supplement.   Taking a live bacteria supplement  (probiotic).   Taking medicine to relax your colon.   Taking antibiotic medicines.    Follow these instructions at home:   Drink 6-8 glasses of water or more each day to prevent constipation.   Try not to strain when you have a bowel movement.   If you have had an infection before:  ? Eat more fiber as directed by your health care provider or your diet and nutrition specialist (dietitian).  ? Take a fiber supplement or probiotic, if your health care provider approves.   Take over-the-counter and prescription medicines only as told by your health care provider.   If you were prescribed an antibiotic, take it as told by your health care provider. Do not stop taking the antibiotic even if you start to feel better.   Keep all follow-up visits as told by your health care provider. This is important.  Contact a health care provider if:   You have pain in your abdomen.   You have bloating.   You have cramps.   You have not had a bowel movement in 3 days.  Get help right away if:   Your pain gets worse.   Your bloating becomes very bad.   You have a fever or chills, and your symptoms suddenly get worse.   You vomit.   You have bowel movements that are bloody or black.   You have   bleeding from your rectum.  Summary   Diverticulosis is a condition that develops when small pouches (diverticula) form in the wall of the large intestine (colon).   You may have a few pouches or many of them.   This condition is most often diagnosed during an exam for other colon problems.   If you have had an infection related to diverticulosis, treatment may include increasing the fiber in your diet, taking supplements, or taking medicines.  This information is not intended to replace advice given to you by your health care provider. Make sure you discuss any questions you have with your health care provider.  Document Released: 04/28/2004 Document Revised: 06/20/2016 Document Reviewed: 06/20/2016  Elsevier Interactive  Patient Education  2017 [employer]

## 2017-02-21 NOTE — Progress Notes (Signed)
Subjective:   Brooke Gallagher is a 61 y.o. G2P0 Caucasian female here for a routine well-woman exam.  No LMP recorded. Patient is postmenopausal.    Current complaints: none PCP: C. Scott       doesn't desire labs  Social History: Sexual: heterosexual Marital Status: single Living situation: alone Occupation: nanny Tobacco/alcohol: no tobacco use Illicit drugs: no history of illicit drug use  The following portions of the patient's history were reviewed and updated as appropriate: allergies, current medications, past family history, past medical history, past social history, past surgical history and problem list.  Past Medical History Past Medical History:  Diagnosis Date  . Allergy   . Colon polyp   . Depression   . Herpes   . Hx: UTI (urinary tract infection)   . Hyperlipidemia   . Hypertension     Past Surgical History Past Surgical History:  Procedure Laterality Date  . [place]   lower back  . TONSILLECTOMY  1963    Gynecologic History G2P0  No LMP recorded. Patient is postmenopausal. Contraception: post menopausal status Last Pap: 2014. Results were: abnormal Last mammogram: 2017. Results were: normal   Obstetric History OB History  Gravida Para Term Preterm AB Living  2         2  SAB TAB Ectopic Multiple Live Births          2    # Outcome Date GA Lbr Len/2nd Weight Sex Delivery Anes PTL Lv  2 Gravida      Vag-Spont   LIV  1 Gravida      Vag-Spont   LIV      Current Medications Current Outpatient Prescriptions on File Prior to Visit  Medication Sig Dispense Refill  . ibuprofen (ADVIL,MOTRIN) 100 MG tablet Take 100 mg by mouth every 6 (six) hours as needed for fever.    . loratadine (CLARITIN) 10 MG tablet Take 10 mg by mouth as needed for allergies.    . Melatonin 10 MG TABS Take by mouth daily.    . Multiple Vitamin (MULTIVITAMIN) tablet Take 1 tablet by mouth daily.     No current facility-administered medications on file prior to  visit.     Review of Systems Patient denies any headaches, blurred vision, shortness of breath, chest pain, abdominal pain, problems with bowel movements, urination, or intercourse.  Objective:  BP (!) 136/94   Pulse 82   Ht 5\' 3"  (1.6 m)   Wt 144 lb 3.2 oz (65.4 kg)   BMI 25.54 kg/m  Physical Exam  General:  Well developed, well nourished, no acute distress. She is alert and oriented x3. Skin:  Warm and dry Neck:  Midline trachea, no thyromegaly or nodules Cardiovascular: Regular rate and rhythm, no murmur heard Lungs:  Effort normal, all lung fields clear to auscultation bilaterally Breasts:  No dominant palpable mass, retraction, or nipple discharge Abdomen:  Soft, non tender, no hepatosplenomegaly or masses Pelvic:  External genitalia is normal in appearance.  The vagina is normal in appearance. The cervix is bulbous, no CMT <28mm cervical polyp noted- removed without difficulty.  Thin prep pap is done with HR HPV cotesting. Uterus is felt to be normal size, shape, and contour.  No adnexal masses or tenderness noted. Extremities:  No swelling or varicosities noted Psych:  She has a normal mood and affect  Assessment:   Healthy well-woman exam Diverticulosis Cervical polyp  Plan:  Refer to lifestyles for education. Will call with polyp  biopsy report and follow up accordingly F/U 1 year for AE, or sooner if needed Mammogram ordered colonoscopy done 3/18 Melody Rockney Ghee, CNM

## 2017-02-22 LAB — CYTOLOGY - PAP

## 2017-03-01 ENCOUNTER — Ambulatory Visit: Payer: BLUE CROSS/BLUE SHIELD | Admitting: Internal Medicine

## 2017-03-22 ENCOUNTER — Encounter: Payer: BLUE CROSS/BLUE SHIELD | Attending: Obstetrics and Gynecology | Admitting: Dietician

## 2017-03-22 ENCOUNTER — Encounter: Payer: Self-pay | Admitting: Dietician

## 2017-03-22 VITALS — Ht 63.0 in | Wt 141.3 lb

## 2017-03-22 DIAGNOSIS — K5730 Diverticulosis of large intestine without perforation or abscess without bleeding: Secondary | ICD-10-CM | POA: Diagnosis not present

## 2017-03-22 NOTE — Progress Notes (Signed)
Medical Nutrition Therapy: Visit start time: 0900  end time: 1000  Assessment:  Diagnosis: diverticulosis Past medical history: borderline HTN (per patient) Psychosocial issues/ stress concerns: none Preferred learning method:  . Auditory . Hands-on . writing  Current weight: 141.3lbs  Height: 5'3" Medications, supplements: reconciled list in medical record  Progress and evaluation: Patient reports recent diagnosis of diverticulosis from routine colonoscopy. She does report 2 episodes of somewhat severe GI symptoms in the recent past, prior to diagnosis. She has milder symptoms including constipation from time to time. She has recently been making diet changes to decrease carb intake and increase vegetables. She requests help in managing GI symptoms as well as with blood pressure control.  Physical activity: cardio and strength training for 20 minutes, 3-4 times a week  Dietary Intake:  Usual eating pattern includes 3 meals and 2-3 snacks per day. Dining out frequency: 0-1 meals per week.  Breakfast: 7-8am 1c. coffee or tea, oatmeal with mixed berries, walnuts, almond milk, cinnamon; or 1 egg with frozen fruit or pineapple or banana Snack: 3-5 dark chocolate covered almonds Lunch: 12:30pm salad with spinach mix, grilled chicken, cucumbers, carrots Snack: fruit-- grapes or apple Supper: meat and vegetables Snack: used to eat popcorn often, now only rarely; or fruit grapes, cherries, banana Beverages: 1c. Coffee or tea in am, otherwise water  Nutrition Care Education: Topics covered: diverticulosis, HTN  Diverticulosis: High fiber (25-30g) diet to promote GI health and prevent diverticulitis; fiber-restricted diet for times when experiencing diverticulitis; FODMAP diet for possible benefit with diverticulosis and other GI symptoms, as patient reports symptoms with dairy foods.  Hypertension:  importance of controlling BP, identifying high sodium foods, identifying food sources of  Calcium, potassium, magnesium; role of exercise in controlling BP.    Nutritional Diagnosis:  [place]-1.4 Altered GI function As related to GI upset and episodes of constipation.  As evidenced by MD diagnosis, patient report.  Intervention: Instruction as noted above.   Set goals with direction from paitent.   She would like to work with FODMAP diet and monitor her intake and symptoms.    No MNT follow-up at this time, patient will schedule later if needed.  Education Materials given:  . High Fiber Diet, and Fiber Restricted Diet (NCM) . FODMAP diet and grocery list . General Guidelines for Hypertension . Low-Sodium Nutrition Therapy (NCM) . Goals/ instructions  Learner/ who was taught:  . Patient   Level of understanding: [name] Kitchen Verbalizes/ demonstrates competency  Demonstrated degree of understanding via:   Teach back Learning barriers: . None  Willingness to learn/ readiness for change: . Eager, change in progress  Monitoring and Evaluation:  Dietary intake, exercise, GI symptoms, BP control, and body weight      follow up: prn

## 2017-03-22 NOTE — Patient Instructions (Signed)
   Try working with FODMAP diet, eliminating high FODMAP foods, and see if digestive symptoms improve.   Take notes on food intake and symptoms, and adjust diet as needed.   Eat/ drink calcium containing foods and/or supplements to equal 1200mg  daily to meet calcium needs and prevent bone loss.

## 2017-03-23 ENCOUNTER — Ambulatory Visit
Admission: RE | Admit: 2017-03-23 | Discharge: 2017-03-23 | Disposition: A | Discharge status: Home / Self Care | Payer: BLUE CROSS/BLUE SHIELD | Source: Ambulatory Visit | Attending: Obstetrics and Gynecology | Admitting: Obstetrics and Gynecology

## 2017-03-23 DIAGNOSIS — Z1231 Encounter for screening mammogram for malignant neoplasm of breast: Secondary | ICD-10-CM | POA: Diagnosis not present

## 2017-03-23 DIAGNOSIS — Z01411 Encounter for gynecological examination (general) (routine) with abnormal findings: Secondary | ICD-10-CM

## 2018-01-29 ENCOUNTER — Encounter: Payer: Self-pay | Admitting: Internal Medicine

## 2018-01-29 NOTE — Telephone Encounter (Signed)
Left message for patient to let her know that she will need to call her GYN for vaginal sore because PCP is out of the office all week.

## 2018-02-05 ENCOUNTER — Ambulatory Visit: Payer: BLUE CROSS/BLUE SHIELD | Admitting: Certified Nurse Midwife

## 2018-02-05 VITALS — Ht 63.0 in | Wt 140.1 lb

## 2018-02-05 DIAGNOSIS — N898 Other specified noninflammatory disorders of vagina: Secondary | ICD-10-CM

## 2018-02-05 NOTE — Progress Notes (Signed)
Pt states she has a "lesion" in vaginal are that is sore and "leaking".

## 2018-02-05 NOTE — Progress Notes (Signed)
GYN ENCOUNTER NOTE  Subjective:       Brooke Gallagher is a 62 y.o. G32P0 female is here for gynecologic evaluation of the following issues:  1. vaginal  Sore that occurred 1-2 wks ago that was painful. She states over the weekend it began to drain and feels much better. In her problem list it notes hs of herpes. Pt denies hx STD's She denies new partners and is currently not sexually active. She denies use of toys. She denies odor, increased vaginal discharge or burning.    Gynecologic History No LMP recorded. Patient is postmenopausal. Contraception: none Last Pap: 02/21/17  Results were: normal Last mammogram: 03/23/17 Results were: normal  Obstetric History OB History  Gravida Para Term Preterm AB Living  2         2  SAB TAB Ectopic Multiple Live Births          2    # Outcome Date GA Lbr Len/2nd Weight Sex Delivery Anes PTL Lv  2 Gravida      Vag-Spont   LIV  1 Gravida      Vag-Spont   LIV    Past Medical History:  Diagnosis Date  . Allergy   . Colon polyp   . Depression   . Herpes   . Hx: UTI (urinary tract infection)   . Hyperlipidemia   . Hypertension     Past Surgical History:  Procedure Laterality Date  . [place]   lower back  . TONSILLECTOMY  1963    Current Outpatient Medications on File Prior to Visit  Medication Sig Dispense Refill  . ibuprofen (ADVIL,MOTRIN) 100 MG tablet Take 100 mg by mouth every 6 (six) hours as needed for fever.    . loratadine (CLARITIN) 10 MG tablet Take 10 mg by mouth as needed for allergies.    . Melatonin 10 MG TABS Take by mouth daily.    . Multiple Vitamin (MULTIVITAMIN) tablet Take 1 tablet by mouth daily.     No current facility-administered medications on file prior to visit.     Allergies  Allergen Reactions  . Penicillins Rash    Social History   Socioeconomic History  . Marital status: Single    Spouse name: Not on file  . Number of children: Not on file  . Years of education: Not on file  .  Highest education level: Not on file  Occupational History  . Not on file  Social Needs  . Financial resource strain: Not on file  . Food insecurity:    Worry: Not on file    Inability: Not on file  . Transportation needs:    Medical: Not on file    Non-medical: Not on file  Tobacco Use  . Smoking status: Never Smoker  . Smokeless tobacco: Never Used  Substance and Sexual Activity  . Alcohol use: Yes    Alcohol/week: 1.8 - 2.4 oz    Types: 3 - 4 Standard drinks or equivalent per week  . Drug use: No  . Sexual activity: Not Currently  Lifestyle  . Physical activity:    Days per week: Not on file    Minutes per session: Not on file  . Stress: Not on file  Relationships  . Social connections:    Talks on phone: Not on file    Gets together: Not on file    Attends religious service: Not on file    Active member of club or organization: Not on  file    Attends meetings of clubs or organizations: Not on file    Relationship status: Not on file  . Intimate partner violence:    Fear of current or ex partner: Not on file    Emotionally abused: Not on file    Physically abused: Not on file    Forced sexual activity: Not on file  Other Topics Concern  . Not on file  Social History Narrative  . Not on file    Family History  Problem Relation Age of Onset  . Arthritis Mother   . Stroke Mother   . Hypertension Mother   . Alzheimer's disease Mother   . Colon cancer Father   . Prostate cancer Father   . Mental illness Sister   . Alcohol abuse Brother   . Colon cancer Paternal Uncle   . Colon cancer Maternal Grandmother   . Mental illness Maternal Grandmother   . Alzheimer's disease Maternal Grandmother   . Hyperlipidemia Maternal Grandfather   . Hypertension Maternal Grandfather   . Diabetes Maternal Grandfather   . Arthritis Paternal Grandmother   . Alcohol abuse Paternal Grandfather   . Breast cancer Neg Hx     The following portions of the patient's history were  reviewed and updated as appropriate: allergies, current medications, past family history, past medical history, past social history, past surgical history and problem list.  Review of Systems Review of Systems - Negative except as mentioned in HPI Review of Systems - General ROS: negative for - chills, fatigue, fever, hot flashes, malaise or night sweats Hematological and Lymphatic ROS: negative for - bleeding problems or swollen lymph nodes Gastrointestinal ROS: negative for - abdominal pain, blood in stools, change in bowel habits and nausea/vomiting Musculoskeletal ROS: negative for - joint pain, muscle pain or muscular weakness Genito-Urinary ROS: negative for - change in menstrual cycle, dysmenorrhea, dyspareunia, dysuria, genital discharge, , hematuria, incontinence, irregular/heavy menses, nocturia or pelvic pain. Positive for genital ulcers  Objective:   Ht 5\' 3"  (1.6 m)   Wt 140 lb 1 oz (63.5 kg)   BMI 24.81 kg/m  CONSTITUTIONAL: Well-developed, well-nourished female in no acute distress.  HENT:  Normocephalic, atraumatic.  NECK: Normal range of motion, supple,.  SKIN: Skin is warm and dry. No rash noted. Not diaphoretic. No erythema. No pallor. [place]: Alert and oriented to person, place, and time.  PSYCHIATRIC: Normal mood and affect. Normal behavior. Normal judgment and thought content. CARDIOVASCULAR:Not Examined RESPIRATORY: Not Examined BREASTS: Not Examined ABDOMEN: Soft, non distended; Non tender.  No Organomegaly. PELVIC:  External Genitalia: Normal, 1 area on left labia that has drained and healing, slight tenderness on palpation  BUS: Normal  Vagina: Normal MUSCULOSKELETAL: Normal range of motion. No tenderness.  No cyanosis, clubbing, or edema.  Assessment:   Vaginal lesion    Plan:  Warm compress to the area or ice prn for tenderness. Wear loose fitting undergarments to allow for air flow. Follow up if worsens or fails to improve. Pt verbalizes  understanding and agrees to plan of care.   Philip Aspen, CNM

## 2018-02-22 ENCOUNTER — Other Ambulatory Visit: Payer: Self-pay | Admitting: Internal Medicine

## 2018-02-22 ENCOUNTER — Other Ambulatory Visit: Payer: Self-pay | Admitting: Obstetrics and Gynecology

## 2018-02-22 DIAGNOSIS — Z1231 Encounter for screening mammogram for malignant neoplasm of breast: Secondary | ICD-10-CM

## 2018-03-26 ENCOUNTER — Ambulatory Visit
Admission: RE | Admit: 2018-03-26 | Discharge: 2018-03-26 | Disposition: A | Discharge status: Home / Self Care | Payer: BLUE CROSS/BLUE SHIELD | Source: Ambulatory Visit | Attending: Internal Medicine | Admitting: Internal Medicine

## 2018-03-26 DIAGNOSIS — Z1231 Encounter for screening mammogram for malignant neoplasm of breast: Secondary | ICD-10-CM | POA: Diagnosis present

## 2018-06-08 ENCOUNTER — Encounter

## 2018-06-08 ENCOUNTER — Ambulatory Visit (INDEPENDENT_AMBULATORY_CARE_PROVIDER_SITE_OTHER): Payer: BLUE CROSS/BLUE SHIELD | Admitting: Internal Medicine

## 2018-06-08 ENCOUNTER — Encounter: Payer: Self-pay | Admitting: Internal Medicine

## 2018-06-08 VITALS — BP 128/84 | HR 63 | Temp 97.7°F | Resp 18 | Ht 63.0 in | Wt 139.6 lb

## 2018-06-08 DIAGNOSIS — E7800 Pure hypercholesterolemia, unspecified: Secondary | ICD-10-CM

## 2018-06-08 DIAGNOSIS — Z0000 Encounter for general adult medical examination without abnormal findings: Secondary | ICD-10-CM

## 2018-06-08 DIAGNOSIS — Z9109 Other allergy status, other than to drugs and biological substances: Secondary | ICD-10-CM | POA: Diagnosis not present

## 2018-06-08 LAB — CBC WITH DIFFERENTIAL/PLATELET
BASOS ABS: 0 10*3/uL (ref 0.0–0.1)
Basophils Relative: 0.9 % (ref 0.0–3.0)
EOS ABS: 0.1 10*3/uL (ref 0.0–0.7)
Eosinophils Relative: 1.2 % (ref 0.0–5.0)
HEMATOCRIT: 39 % (ref 36.0–46.0)
HEMOGLOBIN: 13.3 g/dL (ref 12.0–15.0)
LYMPHS PCT: 28.7 % (ref 12.0–46.0)
Lymphs Abs: 1.4 10*3/uL (ref 0.7–4.0)
MCHC: 34.1 g/dL (ref 30.0–36.0)
MCV: 95 fl (ref 78.0–100.0)
Monocytes Absolute: 0.3 10*3/uL (ref 0.1–1.0)
Monocytes Relative: 6.3 % (ref 3.0–12.0)
Neutro Abs: 3.1 10*3/uL (ref 1.4–7.7)
Neutrophils Relative %: 62.9 % (ref 43.0–77.0)
Platelets: 294 10*3/uL (ref 150.0–400.0)
RBC: 4.1 Mil/uL (ref 3.87–5.11)
RDW: 13.1 % (ref 11.5–15.5)
WBC: 5 10*3/uL (ref 4.0–10.5)

## 2018-06-08 LAB — COMPREHENSIVE METABOLIC PANEL
ALBUMIN: 4.4 g/dL (ref 3.5–5.2)
ALT: 14 U/L (ref 0–35)
AST: 15 U/L (ref 0–37)
Alkaline Phosphatase: 41 U/L (ref 39–117)
BILIRUBIN TOTAL: 0.4 mg/dL (ref 0.2–1.2)
BUN: 19 mg/dL (ref 6–23)
CALCIUM: 9.1 mg/dL (ref 8.4–10.5)
CO2: 29 meq/L (ref 19–32)
Chloride: 105 mEq/L (ref 96–112)
Creatinine, Ser: 0.66 mg/dL (ref 0.40–1.20)
GFR: 95.67 mL/min (ref >60.00)
Glucose, Bld: 90 mg/dL (ref 70–99)
Potassium: 4.4 mEq/L (ref 3.5–5.1)
Sodium: 140 mEq/L (ref 135–145)
Total Protein: 6.9 g/dL (ref 6.0–8.3)

## 2018-06-08 LAB — LIPID PANEL
CHOL/HDL RATIO: 5
CHOLESTEROL: 181 mg/dL (ref 0–200)
HDL: 37.2 mg/dL — ABNORMAL LOW (ref >39.00)
LDL CALC: 130 mg/dL — AB (ref 0–99)
NonHDL: 143.34
Triglycerides: 69 mg/dL (ref 0.0–149.0)
VLDL: 13.8 mg/dL (ref 0.0–40.0)

## 2018-06-08 LAB — TSH: TSH: 3.39 u[IU]/mL (ref 0.35–4.50)

## 2018-06-08 NOTE — Assessment & Plan Note (Signed)
Physical today 06/08/18.  Mammogram 03/26/18 - Birads I.  Colonoscopy 10/13/16 - diverticulosis and internal hemorrhoids.  Recommended f/u in 5 years.

## 2018-06-08 NOTE — Progress Notes (Signed)
Patient ID: Brooke Gallagher, female   DOB: Nov 24, 1955, 62 y.o.   MRN: [id number]   Subjective:    Patient ID: Brooke Gallagher, female    DOB: 17-Oct-1955, 62 y.o.   MRN: [id number]  HPI  Patient here for her physical exam.  She reports she is doing well.  Feels good.  Stays active.  No chest pain.  No sob.  No acid reflux.  No abdominal pain.  Bowels moving.  No urine change.  Has been monitoring her blood pressures.  Better lately.  Latest readings:  110-125/70-80s.     Past Medical History:  Diagnosis Date  . Allergy   . Colon polyp   . Depression   . Herpes   . Hx: UTI (urinary tract infection)   . Hyperlipidemia   . Hypertension    Past Surgical History:  Procedure Laterality Date  . [place]   lower back  . TONSILLECTOMY  1963   Family History  Problem Relation Age of Onset  . Arthritis Mother   . Stroke Mother   . Hypertension Mother   . Alzheimer's disease Mother   . Colon cancer Father   . Prostate cancer Father   . Mental illness Sister   . Alcohol abuse Brother   . Colon cancer Paternal Uncle   . Colon cancer Maternal Grandmother   . Mental illness Maternal Grandmother   . Alzheimer's disease Maternal Grandmother   . Hyperlipidemia Maternal Grandfather   . Hypertension Maternal Grandfather   . Diabetes Maternal Grandfather   . Arthritis Paternal Grandmother   . Alcohol abuse Paternal Grandfather   . Breast cancer Neg Hx    Social History   Socioeconomic History  . Marital status: Single    Spouse name: Not on file  . Number of children: Not on file  . Years of education: Not on file  . Highest education level: Not on file  Occupational History  . Not on file  Social Needs  . Financial resource strain: Not on file  . Food insecurity:    Worry: Not on file    Inability: Not on file  . Transportation needs:    Medical: Not on file    Non-medical: Not on file  Tobacco Use  . Smoking status: Never Smoker  . Smokeless tobacco: Never Used   Substance and Sexual Activity  . Alcohol use: Yes    Alcohol/week: 3.0 - 4.0 standard drinks    Types: 3 - 4 Standard drinks or equivalent per week  . Drug use: No  . Sexual activity: Not Currently  Lifestyle  . Physical activity:    Days per week: Not on file    Minutes per session: Not on file  . Stress: Not on file  Relationships  . Social connections:    Talks on phone: Not on file    Gets together: Not on file    Attends religious service: Not on file    Active member of club or organization: Not on file    Attends meetings of clubs or organizations: Not on file    Relationship status: Not on file  Other Topics Concern  . Not on file  Social History Narrative  . Not on file    Outpatient Encounter Medications as of 06/08/2018  Medication Sig  . ibuprofen (ADVIL,MOTRIN) 100 MG tablet Take 100 mg by mouth every 6 (six) hours as needed for fever.  . loratadine (CLARITIN) 10 MG tablet Take 10 mg by  mouth as needed for allergies.  . Melatonin 10 MG TABS Take by mouth daily.  . Multiple Vitamin (MULTIVITAMIN) tablet Take 1 tablet by mouth daily.   No facility-administered encounter medications on file as of 06/08/2018.     Review of Systems  Constitutional: Negative for appetite change and unexpected weight change.  HENT: Negative for congestion and sinus pressure.   Eyes: Negative for pain and visual disturbance.  Respiratory: Negative for cough, chest tightness and shortness of breath.   Cardiovascular: Negative for chest pain, palpitations and leg swelling.  Gastrointestinal: Negative for abdominal pain, diarrhea, nausea and vomiting.  Genitourinary: Negative for difficulty urinating and dysuria.  Musculoskeletal: Negative for joint swelling and myalgias.  Skin: Negative for color change and rash.  Neurological: Negative for dizziness, light-headedness and headaches.  Hematological: Negative for adenopathy. Does not bruise/bleed easily.  Psychiatric/Behavioral:  Negative for agitation and dysphoric mood.       Objective:    Physical Exam  Constitutional: She is oriented to person, place, and time. She appears well-developed and well-nourished. No distress.  HENT:  Nose: Nose normal.  Mouth/Throat: Oropharynx is clear and moist.  Eyes: Right eye exhibits no discharge. Left eye exhibits no discharge. No scleral icterus.  Neck: Neck supple. No thyromegaly present.  Cardiovascular: Normal rate and regular rhythm.  Pulmonary/Chest: Breath sounds normal. No accessory muscle usage. No tachypnea. No respiratory distress. She has no decreased breath sounds. She has no wheezes. She has no rhonchi. Right breast exhibits no inverted nipple, no mass, no nipple discharge and no tenderness (no axillary adenopathy). Left breast exhibits no inverted nipple, no mass, no nipple discharge and no tenderness (no axilarry adenopathy).  Abdominal: Soft. Bowel sounds are normal. There is no tenderness.  Musculoskeletal: She exhibits no edema or tenderness.  Lymphadenopathy:    She has no cervical adenopathy.  Neurological: She is alert and oriented to person, place, and time.  Skin: No rash noted. No erythema.  Psychiatric: She has a normal mood and affect. Her behavior is normal.    BP 128/84 (BP Location: Left Arm, Patient Position: Sitting, Cuff Size: Normal)   Pulse 63   Temp 97.7 F (36.5 C) (Oral)   Resp 18   Ht 5\' 3"  (1.6 m)   Wt 139 lb 9.6 oz (63.3 kg)   SpO2 97%   BMI 24.73 kg/m  Wt Readings from Last 3 Encounters:  06/08/18 139 lb 9.6 oz (63.3 kg)  02/05/18 140 lb 1 oz (63.5 kg)  03/22/17 141 lb 4.8 oz (64.1 kg)     Lab Results  Component Value Date   WBC 5.0 06/08/2018   HGB 13.3 06/08/2018   HCT 39.0 06/08/2018   PLT 294.0 06/08/2018   GLUCOSE 90 06/08/2018   CHOL 181 06/08/2018   TRIG 69.0 06/08/2018   HDL 37.20 (L) 06/08/2018   LDLDIRECT 146.7 05/03/2013   LDLCALC 130 (H) 06/08/2018   ALT 14 06/08/2018   AST 15 06/08/2018   NA 140  06/08/2018   K 4.4 06/08/2018   CL 105 06/08/2018   CREATININE 0.66 06/08/2018   BUN 19 06/08/2018   CO2 29 06/08/2018   TSH 3.39 06/08/2018    Mm 3d Screen Breast Bilateral  Result Date: 03/26/2018 CLINICAL DATA:  Screening. EXAM: DIGITAL SCREENING BILATERAL MAMMOGRAM WITH TOMO AND CAD COMPARISON:  Previous exam(s). ACR Breast Density Category b: There are scattered areas of fibroglandular density. FINDINGS: There are no findings suspicious for malignancy. Images were processed with CAD. IMPRESSION: No mammographic  evidence of malignancy. A result letter of this screening mammogram will be mailed directly to the patient. RECOMMENDATION: Screening mammogram in one year. (Code:SM-B-01Y) BI-RADS CATEGORY  1: Negative. Electronically Signed   By: Fidela Salisbury M.D.   On: 03/26/2018 19:20       Assessment & Plan:   Problem List Items Addressed This Visit    Environmental allergies    Controlled.        Healthcare maintenance    Physical today 06/08/18.  Mammogram 03/26/18 - Birads I.  Colonoscopy 10/13/16 - diverticulosis and internal hemorrhoids.  Recommended f/u in 5 years.        Hypercholesterolemia - Primary    Low cholesterol diet and exercise.  Follow lipid panel.        Relevant Orders   CBC with Differential/Platelet (Completed)   Comprehensive metabolic panel (Completed)   TSH (Completed)   Lipid panel (Completed)       Einar Pheasant, MD

## 2018-06-10 ENCOUNTER — Encounter: Payer: Self-pay | Admitting: Internal Medicine

## 2018-06-10 NOTE — Assessment & Plan Note (Signed)
Controlled.  

## 2018-06-10 NOTE — Assessment & Plan Note (Signed)
Low cholesterol diet and exercise.  Follow lipid panel.   

## 2018-06-11 ENCOUNTER — Encounter: Payer: Self-pay | Admitting: Internal Medicine

## 2019-03-11 ENCOUNTER — Other Ambulatory Visit: Payer: Self-pay | Admitting: Internal Medicine

## 2019-03-11 DIAGNOSIS — Z1231 Encounter for screening mammogram for malignant neoplasm of breast: Secondary | ICD-10-CM

## 2019-03-28 ENCOUNTER — Other Ambulatory Visit: Payer: Self-pay

## 2019-03-28 ENCOUNTER — Ambulatory Visit
Admission: RE | Admit: 2019-03-28 | Discharge: 2019-03-28 | Disposition: A | Discharge status: Home / Self Care | Payer: Medicare Other | Source: Ambulatory Visit | Attending: Internal Medicine | Admitting: Internal Medicine

## 2019-03-28 DIAGNOSIS — Z1231 Encounter for screening mammogram for malignant neoplasm of breast: Secondary | ICD-10-CM | POA: Diagnosis not present

## 2019-05-13 ENCOUNTER — Telehealth: Payer: Self-pay

## 2019-05-13 NOTE — Telephone Encounter (Signed)
Called patient to let her know that it is ok for her to get flu and pna shot together. Left detailed voicemail.

## 2019-05-13 NOTE — Telephone Encounter (Signed)
Copied from [place] [id number]. Topic: General - Inquiry >> May 13, 2019  9:23 AM Rainey Pines A wrote: Patient would like callback from nurse in regards to if she should get a pneumonia shot with her flu shot

## 2019-05-21 ENCOUNTER — Ambulatory Visit (INDEPENDENT_AMBULATORY_CARE_PROVIDER_SITE_OTHER): Payer: Medicare Other

## 2019-05-21 ENCOUNTER — Other Ambulatory Visit: Payer: Self-pay

## 2019-05-21 DIAGNOSIS — Z23 Encounter for immunization: Secondary | ICD-10-CM | POA: Diagnosis not present

## 2019-06-12 ENCOUNTER — Other Ambulatory Visit: Payer: Self-pay

## 2019-06-13 ENCOUNTER — Telehealth: Payer: Self-pay | Admitting: *Deleted

## 2019-06-13 ENCOUNTER — Other Ambulatory Visit (HOSPITAL_COMMUNITY)
Admission: RE | Admit: 2019-06-13 | Discharge: 2019-06-13 | Disposition: A | Discharge status: Home / Self Care | Payer: Medicare Other | Source: Ambulatory Visit | Attending: Internal Medicine | Admitting: Internal Medicine

## 2019-06-13 ENCOUNTER — Other Ambulatory Visit: Payer: Self-pay

## 2019-06-13 ENCOUNTER — Encounter: Payer: Self-pay | Admitting: Internal Medicine

## 2019-06-13 ENCOUNTER — Ambulatory Visit (INDEPENDENT_AMBULATORY_CARE_PROVIDER_SITE_OTHER): Payer: Medicare Other | Admitting: Internal Medicine

## 2019-06-13 VITALS — BP 118/74 | HR 83 | Temp 97.4°F | Resp 16 | Ht 63.0 in | Wt 149.8 lb

## 2019-06-13 DIAGNOSIS — Z23 Encounter for immunization: Secondary | ICD-10-CM

## 2019-06-13 DIAGNOSIS — Z1151 Encounter for screening for human papillomavirus (HPV): Secondary | ICD-10-CM | POA: Diagnosis not present

## 2019-06-13 DIAGNOSIS — Z124 Encounter for screening for malignant neoplasm of cervix: Secondary | ICD-10-CM

## 2019-06-13 DIAGNOSIS — Z780 Asymptomatic menopausal state: Secondary | ICD-10-CM | POA: Diagnosis not present

## 2019-06-13 DIAGNOSIS — Z0000 Encounter for general adult medical examination without abnormal findings: Secondary | ICD-10-CM | POA: Diagnosis not present

## 2019-06-13 DIAGNOSIS — Z9109 Other allergy status, other than to drugs and biological substances: Secondary | ICD-10-CM | POA: Diagnosis not present

## 2019-06-13 DIAGNOSIS — F439 Reaction to severe stress, unspecified: Secondary | ICD-10-CM

## 2019-06-13 DIAGNOSIS — E7800 Pure hypercholesterolemia, unspecified: Secondary | ICD-10-CM | POA: Diagnosis not present

## 2019-06-13 NOTE — Progress Notes (Signed)
Patient ID: Brooke Gallagher, female   DOB: [birth year], 63 y.o.   MRN: [id number]   Subjective:    Patient ID: Brooke Gallagher, female    DOB: [birth year], 63 y.o.   MRN: [id number]  HPI  Patient with past history of hypertension and hypercholesterolemia.  She comes in today to follow up on these issues as well as for a complete physical exam. She reports she is doing relatively well.  Increased stress.  Her brother is on the list for heart transplant.  No chest pain.  No sob.  No acid reflux.  No abdominal pain.  Bowels moving.  Left foot pain.  Discussed support shoes.  Desires no further w/up.  Discussed prevnar.     Past Medical History:  Diagnosis Date  . Allergy   . Colon polyp   . Depression   . Herpes   . Hx: UTI (urinary tract infection)   . Hyperlipidemia   . Hypertension    Past Surgical History:  Procedure Laterality Date  . [place]   lower back  . TONSILLECTOMY  1963   Family History  Problem Relation Age of Onset  . Arthritis Mother   . Stroke Mother   . Hypertension Mother   . Alzheimer's disease Mother   . Colon cancer Father   . Prostate cancer Father   . Mental illness Sister   . Alcohol abuse Brother   . Colon cancer Paternal Uncle   . Colon cancer Maternal Grandmother   . Mental illness Maternal Grandmother   . Alzheimer's disease Maternal Grandmother   . Hyperlipidemia Maternal Grandfather   . Hypertension Maternal Grandfather   . Diabetes Maternal Grandfather   . Arthritis Paternal Grandmother   . Alcohol abuse Paternal Grandfather   . Breast cancer Neg Hx    Social History   Socioeconomic History  . Marital status: Single    Spouse name: Not on file  . Number of children: Not on file  . Years of education: Not on file  . Highest education level: Not on file  Occupational History  . Not on file  Social Needs  . Financial resource strain: Not on file  . Food insecurity    Worry: Not on file    Inability: Not on file  .  Transportation needs    Medical: Not on file    Non-medical: Not on file  Tobacco Use  . Smoking status: Never Smoker  . Smokeless tobacco: Never Used  Substance and Sexual Activity  . Alcohol use: Yes    Alcohol/week: 3.0 - 4.0 standard drinks    Types: 3 - 4 Standard drinks or equivalent per week  . Drug use: No  . Sexual activity: Not Currently  Lifestyle  . Physical activity    Days per week: Not on file    Minutes per session: Not on file  . Stress: Not on file  Relationships  . Social Herbalist on phone: Not on file    Gets together: Not on file    Attends religious service: Not on file    Active member of club or organization: Not on file    Attends meetings of clubs or organizations: Not on file    Relationship status: Not on file  Other Topics Concern  . Not on file  Social History Narrative  . Not on file    Outpatient Encounter Medications as of 06/13/2019  Medication Sig  . ibuprofen (ADVIL,MOTRIN) 100 MG  tablet Take 100 mg by mouth every 6 (six) hours as needed for fever.  . loratadine (CLARITIN) 10 MG tablet Take 10 mg by mouth as needed for allergies.  . Melatonin 10 MG TABS Take by mouth daily.  . Multiple Vitamin (MULTIVITAMIN) tablet Take 1 tablet by mouth daily.   No facility-administered encounter medications on file as of 06/13/2019.     Review of Systems  Constitutional: Negative for appetite change and unexpected weight change.  HENT: Negative for congestion and sinus pressure.   Eyes: Negative for pain and visual disturbance.  Respiratory: Negative for cough, chest tightness and shortness of breath.   Cardiovascular: Negative for chest pain, palpitations and leg swelling.  Gastrointestinal: Negative for abdominal pain, diarrhea, nausea and vomiting.  Genitourinary: Negative for difficulty urinating and dysuria.  Musculoskeletal: Negative for joint swelling and myalgias.       Left foot discomfort.    Skin: Negative for color change  and rash.  Neurological: Negative for dizziness, light-headedness and headaches.  Hematological: Negative for adenopathy. Does not bruise/bleed easily.  Psychiatric/Behavioral: Negative for agitation and dysphoric mood.       Increased stress as outlined.         Objective:    Physical Exam Constitutional:      General: She is not in acute distress.    Appearance: Normal appearance. She is well-developed.  HENT:     Head: Normocephalic and atraumatic.     Right Ear: External ear normal.     Left Ear: External ear normal.  Eyes:     General: No scleral icterus.       Right eye: No discharge.        Left eye: No discharge.     Conjunctiva/sclera: Conjunctivae normal.  Neck:     Musculoskeletal: Neck supple. No muscular tenderness.     Thyroid: No thyromegaly.  Cardiovascular:     Rate and Rhythm: Normal rate and regular rhythm.  Pulmonary:     Effort: No tachypnea, accessory muscle usage or respiratory distress.     Breath sounds: Normal breath sounds. No decreased breath sounds or wheezing.  Chest:     Breasts:        Right: No inverted nipple, mass, nipple discharge or tenderness (no axillary adenopathy).        Left: No inverted nipple, mass, nipple discharge or tenderness (no axilarry adenopathy).  Abdominal:     General: Bowel sounds are normal.     Palpations: Abdomen is soft.     Tenderness: There is no abdominal tenderness.  Musculoskeletal:        General: No swelling or tenderness.  Lymphadenopathy:     Cervical: No cervical adenopathy.  Skin:    Findings: No erythema or rash.  Neurological:     Mental Status: She is alert and oriented to person, place, and time.  Psychiatric:        Mood and Affect: Mood normal.        Behavior: Behavior normal.     BP 118/74   Pulse 83   Temp (!) 97.4 F (36.3 C)   Resp 16   Wt 149 lb 12.8 oz (67.9 kg)   SpO2 98%   BMI 26.54 kg/m  Wt Readings from Last 3 Encounters:  06/13/19 149 lb 12.8 oz (67.9 kg)  06/08/18  139 lb 9.6 oz (63.3 kg)  02/05/18 140 lb 1 oz (63.5 kg)     Lab Results  Component Value Date   WBC  5.0 06/08/2018   HGB 13.3 06/08/2018   HCT 39.0 06/08/2018   PLT 294.0 06/08/2018   GLUCOSE 90 06/08/2018   CHOL 181 06/08/2018   TRIG 69.0 06/08/2018   HDL 37.20 (L) 06/08/2018   LDLDIRECT 146.7 05/03/2013   LDLCALC 130 (H) 06/08/2018   ALT 14 06/08/2018   AST 15 06/08/2018   NA 140 06/08/2018   K 4.4 06/08/2018   CL 105 06/08/2018   CREATININE 0.66 06/08/2018   BUN 19 06/08/2018   CO2 29 06/08/2018   TSH 3.39 06/08/2018    Mm 3d Screen Breast Bilateral  Result Date: 03/28/2019 CLINICAL DATA:  Screening. EXAM: DIGITAL SCREENING BILATERAL MAMMOGRAM WITH TOMO AND CAD COMPARISON:  Previous exam(s). ACR Breast Density Category b: There are scattered areas of fibroglandular density. FINDINGS: There are no findings suspicious for malignancy. Images were processed with CAD. IMPRESSION: No mammographic evidence of malignancy. A result letter of this screening mammogram will be mailed directly to the patient. RECOMMENDATION: Screening mammogram in one year. (Code:SM-B-01Y) BI-RADS CATEGORY  1: Negative. Electronically Signed   By: Ammie Ferrier M.D.   On: 03/28/2019 12:14       Assessment & Plan:   Problem List Items Addressed This Visit    Environmental allergies    Controlled.        Healthcare maintenance    Physical today 06/13/19.  Mammogram 03/28/19 - Birads I.  Colonoscopy 03/2017 - recommended f/u 5 years.  PAP 06/13/19.        Hypercholesterolemia    Low cholesterol diet and exercise.  Follow lipid panel.       Relevant Orders   CBC with Differential/Platelet   Comprehensive metabolic panel   TSH   Lipid panel   Stress    Discussed with her today.  She does not feel needs anything more at this time. Has good support.  Follow.         Other Visit Diagnoses    Need for vaccination with 13-polyvalent pneumococcal conjugate vaccine    -  Primary   Relevant  Orders   Pneumococcal conjugate vaccine 13-valent (Completed)   Cervical cancer screening       Relevant Orders   Cytology - PAP( [place])       Einar Pheasant, MD

## 2019-06-13 NOTE — Assessment & Plan Note (Addendum)
Physical today 06/13/19.  Mammogram 03/28/19 - Birads I.  Colonoscopy 03/2017 - recommended f/u 5 years.  PAP 06/13/19.

## 2019-06-13 NOTE — Telephone Encounter (Signed)
Please place future orders for lab appt.  

## 2019-06-14 NOTE — Telephone Encounter (Signed)
Orders placed for labs

## 2019-06-16 DIAGNOSIS — F439 Reaction to severe stress, unspecified: Secondary | ICD-10-CM | POA: Insufficient documentation

## 2019-06-16 NOTE — Assessment & Plan Note (Signed)
Low cholesterol diet and exercise.  Follow lipid panel.   

## 2019-06-16 NOTE — Assessment & Plan Note (Signed)
Discussed with her today.  She does not feel needs anything more at this time. Has good support.  Follow.

## 2019-06-16 NOTE — Assessment & Plan Note (Signed)
Controlled.  

## 2019-06-17 ENCOUNTER — Other Ambulatory Visit (INDEPENDENT_AMBULATORY_CARE_PROVIDER_SITE_OTHER): Payer: Medicare Other

## 2019-06-17 ENCOUNTER — Other Ambulatory Visit: Payer: Self-pay

## 2019-06-17 DIAGNOSIS — E7800 Pure hypercholesterolemia, unspecified: Secondary | ICD-10-CM

## 2019-06-17 LAB — COMPREHENSIVE METABOLIC PANEL
ALT: 15 U/L (ref 0–35)
AST: 18 U/L (ref 0–37)
Albumin: 4.3 g/dL (ref 3.5–5.2)
Alkaline Phosphatase: 45 U/L (ref 39–117)
BUN: 17 mg/dL (ref 6–23)
CO2: 27 mEq/L (ref 19–32)
Calcium: 9.4 mg/dL (ref 8.4–10.5)
Chloride: 104 mEq/L (ref 96–112)
Creatinine, Ser: 0.72 mg/dL (ref 0.40–1.20)
GFR: 81.16 mL/min (ref >60.00)
Glucose, Bld: 95 mg/dL (ref 70–99)
Potassium: 4.3 mEq/L (ref 3.5–5.1)
Sodium: 138 mEq/L (ref 135–145)
Total Bilirubin: 0.5 mg/dL (ref 0.2–1.2)
Total Protein: 7.1 g/dL (ref 6.0–8.3)

## 2019-06-17 LAB — TSH: TSH: 3.18 u[IU]/mL (ref 0.35–4.50)

## 2019-06-17 LAB — LIPID PANEL
Cholesterol: 187 mg/dL (ref 0–200)
HDL: 35.5 mg/dL — ABNORMAL LOW (ref >39.00)
LDL Cholesterol: 135 mg/dL — ABNORMAL HIGH (ref 0–99)
NonHDL: 151.84
Total CHOL/HDL Ratio: 5
Triglycerides: 86 mg/dL (ref 0.0–149.0)
VLDL: 17.2 mg/dL (ref 0.0–40.0)

## 2019-06-17 LAB — CBC WITH DIFFERENTIAL/PLATELET
Basophils Absolute: 0 10*3/uL (ref 0.0–0.1)
Basophils Relative: 0.9 % (ref 0.0–3.0)
Eosinophils Absolute: 0 10*3/uL (ref 0.0–0.7)
Eosinophils Relative: 0.6 % (ref 0.0–5.0)
HCT: 41 % (ref 36.0–46.0)
Hemoglobin: 13.7 g/dL (ref 12.0–15.0)
Lymphocytes Relative: 30.7 % (ref 12.0–46.0)
Lymphs Abs: 1.7 10*3/uL (ref 0.7–4.0)
MCHC: 33.5 g/dL (ref 30.0–36.0)
MCV: 94.2 fl (ref 78.0–100.0)
Monocytes Absolute: 0.4 10*3/uL (ref 0.1–1.0)
Monocytes Relative: 6.4 % (ref 3.0–12.0)
Neutro Abs: 3.4 10*3/uL (ref 1.4–7.7)
Neutrophils Relative %: 61.4 % (ref 43.0–77.0)
Platelets: 272 10*3/uL (ref 150.0–400.0)
RBC: 4.35 Mil/uL (ref 3.87–5.11)
RDW: 13.1 % (ref 11.5–15.5)
WBC: 5.5 10*3/uL (ref 4.0–10.5)

## 2019-06-18 ENCOUNTER — Encounter: Payer: Self-pay | Admitting: Internal Medicine

## 2019-06-19 LAB — CYTOLOGY - PAP
Comment: NEGATIVE
Diagnosis: NEGATIVE
High risk HPV: NEGATIVE

## 2019-06-20 ENCOUNTER — Encounter: Payer: Self-pay | Admitting: Internal Medicine

## 2019-08-14 ENCOUNTER — Ambulatory Visit: Payer: Medicare Other | Attending: Internal Medicine

## 2019-08-14 DIAGNOSIS — Z20822 Contact with and (suspected) exposure to covid-19: Secondary | ICD-10-CM

## 2019-08-16 LAB — NOVEL CORONAVIRUS, NAA: SARS-CoV-2, NAA: NOT DETECTED

## 2019-09-09 ENCOUNTER — Ambulatory Visit: Payer: Medicare Other | Attending: Internal Medicine

## 2019-09-09 DIAGNOSIS — Z20822 Contact with and (suspected) exposure to covid-19: Secondary | ICD-10-CM

## 2019-09-10 LAB — NOVEL CORONAVIRUS, NAA: SARS-CoV-2, NAA: NOT DETECTED

## 2019-09-17 ENCOUNTER — Telehealth: Payer: Self-pay | Admitting: Internal Medicine

## 2019-09-17 NOTE — Telephone Encounter (Signed)
Pt called to ask about immunization records.

## 2019-09-17 NOTE — Telephone Encounter (Signed)
Pt called back stating Dr Nicki Reaper called her and she hung up by accident. Please advise and Thank you!

## 2019-09-18 NOTE — Telephone Encounter (Signed)
Patient thinks she had her vaccines in [place]. Not showing in our system or [facility]. Pt is going to send my chart or call back with info from provider in [place] so I can request records.

## 2019-09-27 ENCOUNTER — Ambulatory Visit: Payer: Medicare Other | Attending: Internal Medicine

## 2019-09-27 DIAGNOSIS — Z23 Encounter for immunization: Secondary | ICD-10-CM

## 2019-09-27 NOTE — Progress Notes (Signed)
   Covid-19 Vaccination Clinic  Name:  Brooke Gallagher    MRN: [id number] DOB: 1956-01-25  09/27/2019  Brooke Gallagher was observed post Covid-19 immunization for 15 minutes without incidence. She was provided with Vaccine Information Sheet and instruction to access the V-Safe system.   Brooke Gallagher was instructed to call 911 with any severe reactions post vaccine: [name] Kitchen Difficulty breathing  . Swelling of your face and throat  . A fast heartbeat  . A bad rash all over your body  . Dizziness and weakness    Immunizations Administered    Name Date Dose VIS Date Route   Pfizer COVID-19 Vaccine 09/27/2019 11:50 AM 0.3 mL 07/26/2019 Intramuscular   Manufacturer: [place]   Lot: [id number]   [place]: [id number]

## 2019-10-23 ENCOUNTER — Ambulatory Visit: Payer: Medicare Other | Attending: Internal Medicine

## 2019-10-23 DIAGNOSIS — Z23 Encounter for immunization: Secondary | ICD-10-CM | POA: Insufficient documentation

## 2019-10-23 NOTE — Progress Notes (Signed)
   Covid-19 Vaccination Clinic  Name:  Brooke Gallagher    MRN: [id number] DOB: 1955/10/01  10/23/2019  Brooke Gallagher was observed post Covid-19 immunization for 15 minutes without incident. She was provided with Vaccine Information Sheet and instruction to access the V-Safe system.   Brooke Gallagher was instructed to call 911 with any severe reactions post vaccine: [name] Kitchen Difficulty breathing  . Swelling of face and throat  . A fast heartbeat  . A bad rash all over body  . Dizziness and weakness   Immunizations Administered    Name Date Dose VIS Date Route   Pfizer COVID-19 Vaccine 10/23/2019 10:16 AM 0.3 mL 07/26/2019 Intramuscular   Manufacturer: [place]   Lot: [id number]   [name]: [id number]

## 2019-12-18 ENCOUNTER — Encounter: Payer: Self-pay | Admitting: Internal Medicine

## 2019-12-18 ENCOUNTER — Other Ambulatory Visit: Payer: Self-pay

## 2019-12-18 ENCOUNTER — Ambulatory Visit (INDEPENDENT_AMBULATORY_CARE_PROVIDER_SITE_OTHER): Payer: Medicare Other | Admitting: Internal Medicine

## 2019-12-18 ENCOUNTER — Telehealth: Payer: Self-pay | Admitting: Internal Medicine

## 2019-12-18 DIAGNOSIS — E7800 Pure hypercholesterolemia, unspecified: Secondary | ICD-10-CM | POA: Diagnosis not present

## 2019-12-18 DIAGNOSIS — R198 Other specified symptoms and signs involving the digestive system and abdomen: Secondary | ICD-10-CM | POA: Insufficient documentation

## 2019-12-18 DIAGNOSIS — R030 Elevated blood-pressure reading, without diagnosis of hypertension: Secondary | ICD-10-CM | POA: Insufficient documentation

## 2019-12-18 DIAGNOSIS — Z1231 Encounter for screening mammogram for malignant neoplasm of breast: Secondary | ICD-10-CM | POA: Diagnosis not present

## 2019-12-18 DIAGNOSIS — F439 Reaction to severe stress, unspecified: Secondary | ICD-10-CM | POA: Diagnosis not present

## 2019-12-18 DIAGNOSIS — Z9109 Other allergy status, other than to drugs and biological substances: Secondary | ICD-10-CM

## 2019-12-18 LAB — LIPID PANEL
Cholesterol: 185 mg/dL (ref 0–200)
HDL: 37.1 mg/dL — ABNORMAL LOW (ref >39.00)
LDL Cholesterol: 128 mg/dL — ABNORMAL HIGH (ref 0–99)
NonHDL: 148.1
Total CHOL/HDL Ratio: 5
Triglycerides: 101 mg/dL (ref 0.0–149.0)
VLDL: 20.2 mg/dL (ref 0.0–40.0)

## 2019-12-18 LAB — CBC WITH DIFFERENTIAL/PLATELET
Basophils Absolute: 0 10*3/uL (ref 0.0–0.1)
Basophils Relative: 0.9 % (ref 0.0–3.0)
Eosinophils Absolute: 0.1 10*3/uL (ref 0.0–0.7)
Eosinophils Relative: 1.2 % (ref 0.0–5.0)
HCT: 41.2 % (ref 36.0–46.0)
Hemoglobin: 13.8 g/dL (ref 12.0–15.0)
Lymphocytes Relative: 35 % (ref 12.0–46.0)
Lymphs Abs: 1.8 10*3/uL (ref 0.7–4.0)
MCHC: 33.4 g/dL (ref 30.0–36.0)
MCV: 95.4 fl (ref 78.0–100.0)
Monocytes Absolute: 0.4 10*3/uL (ref 0.1–1.0)
Monocytes Relative: 7.1 % (ref 3.0–12.0)
Neutro Abs: 2.8 10*3/uL (ref 1.4–7.7)
Neutrophils Relative %: 55.8 % (ref 43.0–77.0)
Platelets: 272 10*3/uL (ref 150.0–400.0)
RBC: 4.32 Mil/uL (ref 3.87–5.11)
RDW: 13.4 % (ref 11.5–15.5)
WBC: 5.1 10*3/uL (ref 4.0–10.5)

## 2019-12-18 LAB — BASIC METABOLIC PANEL
BUN: 16 mg/dL (ref 6–23)
CO2: 29 mEq/L (ref 19–32)
Calcium: 8.9 mg/dL (ref 8.4–10.5)
Chloride: 105 mEq/L (ref 96–112)
Creatinine, Ser: 0.72 mg/dL (ref 0.40–1.20)
GFR: 81.03 mL/min (ref >60.00)
Glucose, Bld: 94 mg/dL (ref 70–99)
Potassium: 4.1 mEq/L (ref 3.5–5.1)
Sodium: 139 mEq/L (ref 135–145)

## 2019-12-18 LAB — HEPATIC FUNCTION PANEL
ALT: 18 U/L (ref 0–35)
AST: 20 U/L (ref 0–37)
Albumin: 4.1 g/dL (ref 3.5–5.2)
Alkaline Phosphatase: 42 U/L (ref 39–117)
Bilirubin, Direct: 0 mg/dL (ref 0.0–0.3)
Total Bilirubin: 0.6 mg/dL (ref 0.2–1.2)
Total Protein: 6.8 g/dL (ref 6.0–8.3)

## 2019-12-18 NOTE — Assessment & Plan Note (Signed)
Low cholesterol diet and exercise.  Follow lipid panel.   

## 2019-12-18 NOTE — Telephone Encounter (Signed)
I left vm for pt to call ofc to verify ins.

## 2019-12-18 NOTE — Assessment & Plan Note (Signed)
Abdominal fullness and what feels like a palpable mass on exam.  She has noticed some increased fullness and discomfort.  Check routine labs.  Check CT abdomen and pelvis.

## 2019-12-18 NOTE — Progress Notes (Signed)
Patient ID: Brooke Gallagher, female   DOB: 1956/06/11, 64 y.o.   MRN: [id number]   Subjective:    Patient ID: Brooke Gallagher, female    DOB: July 24, 1956, 64 y.o.   MRN: [id number]  HPI This visit occurred during the SARS-CoV-2 public health emergency.  Safety protocols were in place, including screening questions prior to the visit, additional usage of staff PPE, and extensive cleaning of exam room while observing appropriate contact time as indicated for disinfecting solutions.  Patient here for a scheduled follow up.  She reports increased stress.  Her brother passed away in 09/15/22.  Father has moved to [place].  She has changed jobs.  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  Has good support.  Overall she feels she is handling things relatively well.  Eating.  No nausea or vomiting.  She has noticed some increased abdominal pain and fullness - on questioning.  Notices the discomfort when she tries to exercise and puts pressure on this area.  No bowel change.  Stays active.  Working out in the yard.  Taking otc allergy medication.  Controlling her allergy symptoms.    Past Medical History:  Diagnosis Date  . Allergy   . Colon polyp   . Depression   . Herpes   . Hx: UTI (urinary tract infection)   . Hyperlipidemia   . Hypertension    Past Surgical History:  Procedure Laterality Date  . [place]   lower back  . TONSILLECTOMY  1963   Family History  Problem Relation Age of Onset  . Arthritis Mother   . Stroke Mother   . Hypertension Mother   . Alzheimer's disease Mother   . Colon cancer Father   . Prostate cancer Father   . Mental illness Sister   . Alcohol abuse Brother   . Colon cancer Paternal Uncle   . Colon cancer Maternal Grandmother   . Mental illness Maternal Grandmother   . Alzheimer's disease Maternal Grandmother   . Hyperlipidemia Maternal Grandfather   . Hypertension Maternal Grandfather   . Diabetes Maternal Grandfather   . Arthritis Paternal  Grandmother   . Alcohol abuse Paternal Grandfather   . Breast cancer Neg Hx    Social History   Socioeconomic History  . Marital status: Single    Spouse name: Not on file  . Number of children: Not on file  . Years of education: Not on file  . Highest education level: Not on file  Occupational History  . Not on file  Tobacco Use  . Smoking status: Never Smoker  . Smokeless tobacco: Never Used  Substance and Sexual Activity  . Alcohol use: Yes    Alcohol/week: 3.0 - 4.0 standard drinks    Types: 3 - 4 Standard drinks or equivalent per week  . Drug use: No  . Sexual activity: Not Currently  Other Topics Concern  . Not on file  Social History Narrative  . Not on file   Social Determinants of Health   Financial Resource Strain:   . Difficulty of Paying Living Expenses:   Food Insecurity:   . Worried About Charity fundraiser in the Last Year:   . Arboriculturist in the Last Year:   Transportation Needs:   . Film/video editor (Medical):   [name] Kitchen Lack of Transportation (Non-Medical):   Physical Activity:   . Days of Exercise per Week:   . Minutes of Exercise per Session:  Stress:   . Feeling of Stress :   Social Connections:   . Frequency of Communication with Friends and Family:   . Frequency of Social Gatherings with Friends and Family:   . Attends Religious Services:   . Active Member of Clubs or Organizations:   . Attends Archivist Meetings:   [name] Kitchen Marital Status:     Outpatient Encounter Medications as of 12/18/2019  Medication Sig  . ibuprofen (ADVIL,MOTRIN) 100 MG tablet Take 100 mg by mouth every 6 (six) hours as needed for fever.  . loratadine (CLARITIN) 10 MG tablet Take 10 mg by mouth as needed for allergies.  . Melatonin 10 MG TABS Take by mouth daily.  . Multiple Vitamin (MULTIVITAMIN) tablet Take 1 tablet by mouth daily.   No facility-administered encounter medications on file as of 12/18/2019.    Review of Systems  Constitutional: Negative  for appetite change and unexpected weight change.  HENT: Negative for sinus pressure.        Allergy symptoms controlled with otc medication.   Respiratory: Negative for cough, chest tightness and shortness of breath.   Cardiovascular: Negative for chest pain, palpitations and leg swelling.  Gastrointestinal: Negative for diarrhea, nausea and vomiting.       Abdominal discomfort as outlined.    Genitourinary: Negative for difficulty urinating and dysuria.  Musculoskeletal: Negative for joint swelling and myalgias.  Skin: Negative for color change and rash.  Neurological: Negative for dizziness, light-headedness and headaches.  Psychiatric/Behavioral: Negative for agitation and dysphoric mood.       Objective:    Physical Exam Constitutional:      General: She is not in acute distress.    Appearance: Normal appearance.  HENT:     Head: Normocephalic and atraumatic.     Right Ear: External ear normal.     Left Ear: External ear normal.  Eyes:     General: No scleral icterus.       Right eye: No discharge.        Left eye: No discharge.     Conjunctiva/sclera: Conjunctivae normal.  Neck:     Thyroid: No thyromegaly.  Cardiovascular:     Rate and Rhythm: Normal rate and regular rhythm.  Pulmonary:     Effort: No respiratory distress.     Breath sounds: Normal breath sounds. No wheezing.  Abdominal:     General: Bowel sounds are normal.     Palpations: Abdomen is soft.     Comments: Increased fullness and question of mass - mid abdomen.  Minimal tenderness to palpation.    Musculoskeletal:        General: No swelling or tenderness.     Cervical back: Neck supple. No tenderness.  Lymphadenopathy:     Cervical: No cervical adenopathy.  Skin:    Findings: No erythema or rash.  Neurological:     Mental Status: She is alert.  Psychiatric:        Mood and Affect: Mood normal.        Behavior: Behavior normal.    Blood pressure rechecked by me:  136-140/88-90  BP 124/70    Pulse 74   Temp 97.7 F (36.5 C)   Resp 16   Ht 5\' 3"  (1.6 m)   Wt 154 lb (69.9 kg)   SpO2 97%   BMI 27.28 kg/m  Wt Readings from Last 3 Encounters:  12/18/19 154 lb (69.9 kg)  06/13/19 149 lb 12.8 oz (67.9 kg)  06/08/18 139 lb 9.6  oz (63.3 kg)     Lab Results  Component Value Date   WBC 5.5 06/17/2019   HGB 13.7 06/17/2019   HCT 41.0 06/17/2019   PLT 272.0 06/17/2019   GLUCOSE 95 06/17/2019   CHOL 187 06/17/2019   TRIG 86.0 06/17/2019   HDL 35.50 (L) 06/17/2019   LDLDIRECT 146.7 05/03/2013   LDLCALC 135 (H) 06/17/2019   ALT 15 06/17/2019   AST 18 06/17/2019   NA 138 06/17/2019   K 4.3 06/17/2019   CL 104 06/17/2019   CREATININE 0.72 06/17/2019   BUN 17 06/17/2019   CO2 27 06/17/2019   TSH 3.18 06/17/2019       Assessment & Plan:   Problem List Items Addressed This Visit    Abdominal fullness    Abdominal fullness and what feels like a palpable mass on exam.  She has noticed some increased fullness and discomfort.  Check routine labs.  Check CT abdomen and pelvis.        Relevant Orders   CT Abdomen Pelvis W Contrast   Elevated blood pressure reading    Blood pressure elevated on my check.  Have her spot check her pressure.  Get her back in soon to reassess.        Environmental allergies    Controlled with otc medication.        Hypercholesterolemia    Low cholesterol diet and exercise.  Follow lipid panel.       Relevant Orders   CBC with Differential/Platelet   Hepatic function panel   Lipid panel   Basic metabolic panel   Stress    Increased stress as outlined.  Discussed with her today.  She has good support.  Does not feel needs anything more at this time.  Follow.        Other Visit Diagnoses    Encounter for screening mammogram for malignant neoplasm of breast       Relevant Orders   MM 3D SCREEN BREAST BILATERAL       Einar Pheasant, MD

## 2019-12-18 NOTE — Telephone Encounter (Signed)
Pt called back returning your call She stated that the only insurance she has in [facility]

## 2019-12-18 NOTE — Assessment & Plan Note (Signed)
Controlled with otc medication.

## 2019-12-18 NOTE — Assessment & Plan Note (Signed)
Blood pressure elevated on my check.  Have her spot check her pressure.  Get her back in soon to reassess.

## 2019-12-18 NOTE — Assessment & Plan Note (Signed)
Increased stress as outlined.  Discussed with her today.  She has good support.  Does not feel needs anything more at this time.  Follow.   

## 2019-12-19 ENCOUNTER — Encounter: Payer: Self-pay | Admitting: Internal Medicine

## 2019-12-20 NOTE — Telephone Encounter (Signed)
Ok. Thank you.

## 2019-12-27 ENCOUNTER — Ambulatory Visit (INDEPENDENT_AMBULATORY_CARE_PROVIDER_SITE_OTHER): Payer: Medicare Other

## 2019-12-27 VITALS — BP 134/93 | Ht 63.0 in | Wt 154.0 lb

## 2019-12-27 DIAGNOSIS — Z780 Asymptomatic menopausal state: Secondary | ICD-10-CM | POA: Diagnosis not present

## 2019-12-27 DIAGNOSIS — Z1159 Encounter for screening for other viral diseases: Secondary | ICD-10-CM

## 2019-12-27 DIAGNOSIS — Z0000 Encounter for general adult medical examination without abnormal findings: Secondary | ICD-10-CM | POA: Diagnosis not present

## 2019-12-27 NOTE — Progress Notes (Addendum)
Subjective:   Brooke Gallagher is a 64 y.o. female who presents for an Initial Medicare Annual Wellness Visit.  Review of Systems    No ROS.  Medicare Wellness Virtual Visit.  Visual/audio telehealth visit. Vital signs provided by patient. See social history for additional risk factors.    Cardiac Risk Factors include: advanced age (>61men, >49 women);hypertension     Objective:    Today's Vitals   12/27/19 0835  BP: (!) 134/93  Weight: 154 lb (69.9 kg)  Height: 5\' 3"  (1.6 m)   Body mass index is 27.28 kg/m.  Advanced Directives 12/27/2019 03/22/2017  Does Patient Have a Medical Advance Directive? Yes No  Type of Paramedic of [place];Living will -  Does patient want to make changes to medical advance directive? No - Patient declined -  Copy of [place] in Chart? Yes - validated most recent copy scanned in chart (See row information) -  Would patient like information on creating a medical advance directive? - Yes (MAU/Ambulatory/Procedural Areas - Information given)    Current Medications (verified) Outpatient Encounter Medications as of 12/27/2019  Medication Sig  . ibuprofen (ADVIL,MOTRIN) 100 MG tablet Take 100 mg by mouth every 6 (six) hours as needed for fever.  . loratadine (CLARITIN) 10 MG tablet Take 10 mg by mouth as needed for allergies.  . Melatonin 10 MG TABS Take by mouth daily.  . Multiple Vitamin (MULTIVITAMIN) tablet Take 1 tablet by mouth daily.   No facility-administered encounter medications on file as of 12/27/2019.    Allergies (verified) Penicillins   History: Past Medical History:  Diagnosis Date  . Allergy   . Colon polyp   . Depression   . Herpes   . Hx: UTI (urinary tract infection)   . Hyperlipidemia   . Hypertension    Past Surgical History:  Procedure Laterality Date  . [place]   lower back  . TONSILLECTOMY  1963   Family History  Problem Relation Age of Onset  .  Arthritis Mother   . Stroke Mother   . Hypertension Mother   . Alzheimer's disease Mother   . Dementia Mother   . Colon cancer Father   . Prostate cancer Father   . Mental illness Sister   . Alcohol abuse Brother   . Congestive Heart Failure Brother   . Colon cancer Paternal Uncle   . Colon cancer Maternal Grandmother   . Mental illness Maternal Grandmother   . Alzheimer's disease Maternal Grandmother   . Hyperlipidemia Maternal Grandfather   . Hypertension Maternal Grandfather   . Diabetes Maternal Grandfather   . Arthritis Paternal Grandmother   . Alcohol abuse Paternal Grandfather   . Breast cancer Neg Hx    Social History   Socioeconomic History  . Marital status: Single    Spouse name: Not on file  . Number of children: Not on file  . Years of education: Not on file  . Highest education level: Not on file  Occupational History  . Not on file  Tobacco Use  . Smoking status: Never Smoker  . Smokeless tobacco: Never Used  Substance and Sexual Activity  . Alcohol use: Yes    Alcohol/week: 1.0 - 2.0 standard drinks    Types: 1 - 2 Standard drinks or equivalent per week  . Drug use: No  . Sexual activity: Not Currently  Other Topics Concern  . Not on file  Social History Narrative  . Not  on file   Social Determinants of Health   Financial Resource Strain:   . Difficulty of Paying Living Expenses:   Food Insecurity:   . Worried About Charity fundraiser in the Last Year:   . Arboriculturist in the Last Year:   Transportation Needs:   . Film/video editor (Medical):   [name] Kitchen Lack of Transportation (Non-Medical):   Physical Activity:   . Days of Exercise per Week:   . Minutes of Exercise per Session:   Stress:   . Feeling of Stress :   Social Connections:   . Frequency of Communication with Friends and Family:   . Frequency of Social Gatherings with Friends and Family:   . Attends Religious Services:   . Active Member of Clubs or Organizations:   . Attends  Archivist Meetings:   [name] Kitchen Marital Status:     Tobacco Counseling Counseling given: Not Answered   Clinical Intake:  Pre-visit preparation completed: Yes           How often do you need to have someone help you when you read instructions, pamphlets, or other written materials from your doctor or pharmacy?: 1 - Never  Interpreter Needed?: No      Activities of Daily Living In your present state of health, do you have any difficulty performing the following activities: 12/27/2019  Hearing? N  Vision? N  Difficulty concentrating or making decisions? N  Walking or climbing stairs? N  Dressing or bathing? N  Doing errands, shopping? N  Preparing Food and eating ? N  Using the Toilet? N  In the past six months, have you accidently leaked urine? N  Comment Managed by daily pad/liner  Do you have problems with loss of bowel control? N  Managing your Medications? N  Managing your Finances? N  Housekeeping or managing your Housekeeping? N  Some recent data might be hidden     Immunizations and Health Maintenance Immunization History  Administered Date(s) Administered  . Fluad Quad(high Dose 65+) 05/21/2019  . PFIZER SARS-COV-2 Vaccination 09/27/2019, 10/23/2019  . Pneumococcal Conjugate-13 06/13/2019   Health Maintenance Due  Topic Date Due  . Hepatitis C Screening  Never done  . TETANUS/TDAP  Never done  . DEXA SCAN  Never done    Patient Care Team: Einar Pheasant, MD as PCP - General (Internal Medicine)  Indicate any recent Medical Services you may have received from other than [facility] providers in the past year (date may be approximate).     Assessment:   This is a routine wellness examination for Brooke Gallagher.  Nurse connected with patient 12/27/19 at  8:30 AM EDT by a telephone enabled telemedicine application, patient has no access to video or difficulty connecting with video, and verified that I am speaking with the correct person using two identifiers.  Patient stated full name and DOB. Patient gave permission to continue with telephone visit. Patient's location was at home and Nurse's location was at [place] office.   Patient is alert and oriented x3. Patient denies difficulty focusing or concentrating. Patient likes to read and complete puzzles for brain health.  Health Maintenance Due: -Tdap vaccine- discussed; to be completed with doctor in visit or local pharmacy.   See completed HM at the end of note.   Eye: Visual acuity not assessed. Virtual visit. Followed by their ophthalmologist.  Dental: UTD   Hearing: Demonstrates normal hearing during visit.  Safety:  Patient feels safe at home- yes Patient does  have smoke detectors at home- yes Patient does wear sunscreen or protective clothing when in direct sunlight - yes Patient does wear seat belt when in a moving vehicle - yes Patient drives- yes Adequate lighting in walkways free from debris- yes Grab bars and handrails used as appropriate- yes Ambulates with an assistive device- no Cell phone on person when ambulating outside of the home-yes  Social: Alcohol intake - yes      Smoking history- never  Smokers in home? none Illicit drug use? none  Medication: Taking as directed and without issues.  Self managed - yes   Covid-19: Precautions and sickness symptoms discussed. Wears mask, social distancing, hand hygiene as appropriate.   Activities of Daily Living Patient denies needing assistance with: household chores, feeding themselves, getting from bed to chair, getting to the toilet, bathing/showering, dressing, managing money, or preparing meals.   Discussed the importance of a healthy diet, water intake and the benefits of aerobic exercise.    Physical activity- walking 30 minutes every/exercise dvd 20 minutes  Diet:  Low sodium  Modified carb Lactose free Water: good intake Caffeine: 1 cup of coffee  Other Providers Patient Care Team: Einar Pheasant,  MD as PCP - General (Internal Medicine)  Hearing/Vision screen  Hearing Screening   125Hz  250Hz  500Hz  1000Hz  2000Hz  3000Hz  4000Hz  6000Hz  8000Hz   Right ear:           Left ear:           Comments: Patient is able to hear conversational tones without difficulty.  No issues reported.   Vision Screening Comments: Wears corrective lenses Visual acuity not assessed, virtual visit.  They have seen their ophthalmologist.     Dietary issues and exercise activities discussed: Current Exercise Habits: Home exercise routine, Type of exercise: walking;calisthenics, Intensity: Mild  Goals      Patient Stated   . I would like to lower my weight and lose 20lbs (pt-stated)      Depression Screen PHQ 2/9 Scores 12/27/2019 03/22/2017 06/22/2016  PHQ - 2 Score 0 0 0    Fall Risk Fall Risk  12/27/2019 06/13/2019 03/22/2017 06/22/2016  Falls in the past year? 0 1 No No  Number falls in past yr: - 0 - -  Injury with Fall? - 1 - -  Follow up Falls evaluation completed Falls evaluation completed - -   Timed Get Up and Go Performed no, virtual visit  Cognitive Function:     6CIT Screen 12/27/2019  What Year? 0 points  What month? 0 points  What time? 0 points  Months in reverse 0 points  Repeat phrase 0 points    Screening Tests Health Maintenance  Topic Date Due  . Hepatitis C Screening  Never done  . TETANUS/TDAP  Never done  . DEXA SCAN  Never done  . PNA vac Low Risk Adult (2 of 2 - PPSV23) 06/12/2020  . MAMMOGRAM  03/27/2021  . COLONOSCOPY  10/14/2026  . COVID-19 Vaccine  Completed  . INFLUENZA VACCINE  Discontinued     Plan:   Keep all routine maintenance appointments.   Follow up 01/22/20 @ 9:30.   Dexa Scan- discussed. Consent to order. Facility will contact for scheduling.   Hep C Screening- discussed. Consent to order, future lab.   Medicare Attestation I have personally reviewed: The patient's medical and social history Their use of alcohol, tobacco or illicit  drugs Their current medications and supplements The patient's functional ability including ADLs,fall risks, home safety risks,  cognitive, and hearing and visual impairment Diet and physical activities Evidence for depression   I have reviewed and discussed with patient certain preventive protocols, quality metrics, and best practice recommendations.     Brooke Biles, LPN   [id number]    Reviewed above information.  Agree with assessment and plan.    Dr Nicki Reaper

## 2019-12-27 NOTE — Patient Instructions (Addendum)
  Ms. Hoecker , Thank you for taking time to come for your Medicare Wellness Visit. I appreciate your ongoing commitment to your health goals. Please review the following plan we discussed and let me know if I can assist you in the future.   These are the goals we discussed: Goals      Patient Stated   . I would like to lower my weight and lose 20lbs (pt-stated)       This is a list of the screening recommended for you and due dates:  Health Maintenance  Topic Date Due  .  Hepatitis C: One time screening is recommended by Center for Disease Control  (CDC) for  adults born from [birth year] through 1967.   Never done  . Tetanus Vaccine  Never done  . DEXA scan (bone density measurement)  Never done  . Pneumonia vaccines (2 of 2 - PPSV23) 06/12/2020  . Mammogram  03/27/2021  . Colon Cancer Screening  10/14/2026  . COVID-19 Vaccine  Completed  . Flu Shot  Discontinued

## 2020-01-01 ENCOUNTER — Other Ambulatory Visit: Payer: Self-pay

## 2020-01-01 ENCOUNTER — Ambulatory Visit
Admission: RE | Admit: 2020-01-01 | Discharge: 2020-01-01 | Disposition: A | Discharge status: Home / Self Care | Payer: Medicare Other | Source: Ambulatory Visit | Attending: Internal Medicine | Admitting: Internal Medicine

## 2020-01-01 DIAGNOSIS — R198 Other specified symptoms and signs involving the digestive system and abdomen: Secondary | ICD-10-CM | POA: Diagnosis not present

## 2020-01-01 MED ORDER — IOHEXOL 300 MG/ML  SOLN
100.0000 mL | Freq: Once | INTRAMUSCULAR | Status: AC | PRN
  Administered 2020-01-01: 100 mL via INTRAVENOUS

## 2020-01-07 ENCOUNTER — Telehealth: Payer: Self-pay | Admitting: Internal Medicine

## 2020-01-07 DIAGNOSIS — N949 Unspecified condition associated with female genital organs and menstrual cycle: Secondary | ICD-10-CM

## 2020-01-07 NOTE — Telephone Encounter (Signed)
Pt was inquiring about a referral to Dr. Theora Gianotti. Their fax number is [phone]

## 2020-01-08 NOTE — Telephone Encounter (Signed)
I had already spoke to Ms Pankonin about her CT scan.  Recommended for her to see Dr Theora Gianotti.  She is a gyn/oncologist from [place] [facility] that comes to [place].  Referral has been placed.  Someone should be contacting her with an appt date and time.  Please notify.  Thanks.

## 2020-01-08 NOTE — Telephone Encounter (Signed)
Lars Masson, LPN  [id number]  [id number] PM EDT    Patient aware of results.    Einar Pheasant, MD  01/03/2020  5:32 AM EDT    See me before calling pt.  I have already spoke to pt about her CT results.  Also discussed with her need for referral.  I had told her we would call back and let her know who felt best to refer to.  I would like to refer her to Dr Theora Gianotti.  She is a [facility] MD that comes to [place] - to the [facility] [employer]  I would like for Ms Bradsher to see her and see about further tissue diagnosis.      Please advise

## 2020-01-09 NOTE — Telephone Encounter (Signed)
Patient informed and verbalized understanding.  Gave appointment line number to call if she has not heard back from them by next week. [phone]

## 2020-01-14 ENCOUNTER — Telehealth: Payer: Self-pay

## 2020-01-14 NOTE — Telephone Encounter (Signed)
Spoke with Ms Bernabe and she is now scheduled for 01/15/20 at 1330 to see Dr. Theora Gianotti. Directions to cancer center given.

## 2020-01-14 NOTE — Telephone Encounter (Signed)
Voicemail left with Ms. Bensing to return call regarding referral to gyn oncology. Dr. Theora Gianotti will be in clinic on 6/2 at can consult with her at 1330.

## 2020-01-15 ENCOUNTER — Inpatient Hospital Stay: Payer: Medicare Other

## 2020-01-15 ENCOUNTER — Inpatient Hospital Stay: Payer: Medicare Other | Attending: Obstetrics and Gynecology | Admitting: Obstetrics and Gynecology

## 2020-01-15 ENCOUNTER — Ambulatory Visit
Admission: RE | Admit: 2020-01-15 | Discharge: 2020-01-15 | Disposition: A | Discharge status: Home / Self Care | Payer: Medicare Other | Source: Ambulatory Visit | Attending: Obstetrics and Gynecology | Admitting: Obstetrics and Gynecology

## 2020-01-15 ENCOUNTER — Other Ambulatory Visit: Payer: Self-pay

## 2020-01-15 DIAGNOSIS — R978 Other abnormal tumor markers: Secondary | ICD-10-CM | POA: Diagnosis not present

## 2020-01-15 DIAGNOSIS — N1330 Unspecified hydronephrosis: Secondary | ICD-10-CM | POA: Diagnosis not present

## 2020-01-15 DIAGNOSIS — Z1273 Encounter for screening for malignant neoplasm of ovary: Secondary | ICD-10-CM | POA: Insufficient documentation

## 2020-01-15 DIAGNOSIS — N9489 Other specified conditions associated with female genital organs and menstrual cycle: Secondary | ICD-10-CM | POA: Diagnosis present

## 2020-01-15 DIAGNOSIS — I10 Essential (primary) hypertension: Secondary | ICD-10-CM | POA: Diagnosis not present

## 2020-01-15 DIAGNOSIS — D3911 Neoplasm of uncertain behavior of right ovary: Secondary | ICD-10-CM

## 2020-01-15 DIAGNOSIS — Z01818 Encounter for other preprocedural examination: Secondary | ICD-10-CM

## 2020-01-15 DIAGNOSIS — E785 Hyperlipidemia, unspecified: Secondary | ICD-10-CM | POA: Diagnosis not present

## 2020-01-15 DIAGNOSIS — E7800 Pure hypercholesterolemia, unspecified: Secondary | ICD-10-CM | POA: Insufficient documentation

## 2020-01-15 LAB — COMPREHENSIVE METABOLIC PANEL
ALT: 19 U/L (ref 0–44)
AST: 20 U/L (ref 15–41)
Albumin: 4.5 g/dL (ref 3.5–5.0)
Alkaline Phosphatase: 46 U/L (ref 38–126)
Anion gap: 7 (ref 5–15)
BUN: 13 mg/dL (ref 8–23)
CO2: 29 mmol/L (ref 22–32)
Calcium: 9.3 mg/dL (ref 8.9–10.3)
Chloride: 103 mmol/L (ref 98–111)
Creatinine, Ser: 0.67 mg/dL (ref 0.44–1.00)
GFR calc Af Amer: >60 mL/min (ref >60)
GFR calc non Af Amer: >60 mL/min (ref >60)
Glucose, Bld: 104 mg/dL — ABNORMAL HIGH (ref 70–99)
Potassium: 4.3 mmol/L (ref 3.5–5.1)
Sodium: 139 mmol/L (ref 135–145)
Total Bilirubin: 0.6 mg/dL (ref 0.3–1.2)
Total Protein: 7.8 g/dL (ref 6.5–8.1)

## 2020-01-15 LAB — CBC WITH DIFFERENTIAL/PLATELET
Abs Immature Granulocytes: 0.01 10*3/uL (ref 0.00–0.07)
Basophils Absolute: 0.1 10*3/uL (ref 0.0–0.1)
Basophils Relative: 1 %
Eosinophils Absolute: 0 10*3/uL (ref 0.0–0.5)
Eosinophils Relative: 1 %
HCT: 41.2 % (ref 36.0–46.0)
Hemoglobin: 13.9 g/dL (ref 12.0–15.0)
Immature Granulocytes: 0 %
Lymphocytes Relative: 27 %
Lymphs Abs: 1.7 10*3/uL (ref 0.7–4.0)
MCH: 31.2 pg (ref 26.0–34.0)
MCHC: 33.7 g/dL (ref 30.0–36.0)
MCV: 92.6 fL (ref 80.0–100.0)
Monocytes Absolute: 0.4 10*3/uL (ref 0.1–1.0)
Monocytes Relative: 6 %
Neutro Abs: 4.3 10*3/uL (ref 1.7–7.7)
Neutrophils Relative %: 65 %
Platelets: 276 10*3/uL (ref 150–400)
RBC: 4.45 MIL/uL (ref 3.87–5.11)
RDW: 12.6 % (ref 11.5–15.5)
WBC: 6.5 10*3/uL (ref 4.0–10.5)
nRBC: 0 % (ref 0.0–0.2)

## 2020-01-15 NOTE — Patient Instructions (Signed)
DIVISION OF GYNECOLOGIC ONCOLOGY BOWEL PREP   The following instructions are extremely important to prepare for your surgery. Please follow them carefully   Step 1: Liquid Diet Instructions              Clear Liquid Diet for GYN Oncology Patients Day Before Surgery The day before your scheduled surgery DO NOT EAT any solid foods.  We do want you to drink enough liquids, but NO MILK products.  We do not want you to be dehydrated.  Clear liquids are defined as no milk products and no pieces of any solid food. Drink at least 64 oz. of fluid.  The following are all approved for you to drink the day before you surgery.  Chicken, Beef or Vegetable Broth (bouillon or consomm) - NO BROTH AFTER MIDNIGHT  Plain Jello  (no fruit)  Water  Strained lemonade or fruit punch  Gatorade (any flavor)  CLEAR Ensure or Boost Breeze  Fruit juices without pulp, such as apple, grape, or cranberry juice  Clear sodas - NO SODA AFTER MIDNIGHT  Ice Pops without bits of fruit or fruit pulp  Honey  Tea or coffee without milk or cream                 Any foods not on the above list should be avoided                                                                                             Step 2: Laxatives           The evening before surgery:   Time: around 5pm   Follow these instructions carefully.   Administer 1 Dulcolax suppository according to manufacturer instructions on the box. You will need to purchase this laxative at a pharmacy or grocery store.    Individual responses to laxatives vary; this prep may cause multiple bowel movements. It often works in 30 minutes and may take as long as 3 hours. Stay near an available bathroom.    It is important to stay hydrated. Ensure you are still drinking clear liquids.     IMPORTANT: FOR YOUR SAFETY, WE WILL HAVE TO CANCEL YOUR SURGERY IF YOU DO NOT FOLLOW THESE INSTRUCTIONS.    Do not eat anything after midnight (including gum or  candy) prior to your surgery.  Avoid drinking carbonated beverages after midnight.  You can have clear liquids up until one hour before you arrive at the hospital. "Nothing by mouth" means no liquids, gum, candy, etc for one hour before your arrival time.      Laparoscopy Laparoscopy is a procedure to diagnose diseases in the abdomen. During the procedure, a thin, lighted, pencil-sized instrument called a laparoscope is inserted into the abdomen through an incision. The laparoscope allows your health care provider to look at the organs inside your body. LET YOUR HEALTH CARE PROVIDER KNOW ABOUT:  Any allergies you have.  All medicines you are taking, including vitamins, herbs, eye drops, creams, and over-the-counter medicines.  Previous problems you or members of your family have had with the use of anesthetics.  Any blood   disorders you have.  Previous surgeries you have had.  Medical conditions you have. RISKS AND COMPLICATIONS  Generally, this is a safe procedure. However, problems can occur, which may include:  Infection.  Bleeding.  Damage to other organs.  Allergic reaction to the anesthetics used during the procedure. BEFORE THE PROCEDURE  Do not eat or drink anything after midnight on the night before the procedure or as directed by your health care provider.  Ask your health care provider about: ? Changing or stopping your regular medicines. ? Taking medicines such as aspirin and ibuprofen. These medicines can thin your blood. Do not take these medicines before your procedure if your health care provider instructs you not to.  Plan to have someone take you home after the procedure. PROCEDURE  You may be given a medicine to help you relax (sedative).  You will be given a medicine to make you sleep (general anesthetic).  Your abdomen will be inflated with a gas. This will make your organs easier to see.  Small incisions will be made in your abdomen.  A  laparoscope and other small instruments will be inserted into the abdomen through the incisions.  A tissue sample may be removed from an organ in the abdomen for examination.  The instruments will be removed from the abdomen.  The gas will be released.  The incisions will be closed with stitches (sutures). AFTER THE PROCEDURE  Your blood pressure, heart rate, breathing rate, and blood oxygen level will be monitored often until the medicines you were given have worn off.   This information is not intended to replace advice given to you by your health care provider. Make sure you discuss any questions you have with your health care provider.                                             Bowel Symptoms After Surgery After gynecologic surgery, women often have temporary changes in bowel function (constipation and gas pain).  Following are tips to help prevent and treat common bowel problems.  It also tells you when to call the doctor.  This is important because some symptoms might be a sign of a more serious bowel problem such as obstruction (bowel blockage).  These problems are rare but can happen after gynecologic surgery.   Besides surgery, what can temporarily affect bowel function? 1. Dietary changes   2. Decreased physical activity   3.Antibiotics   4. Pain medication   How can I prevent constipation (three days or more without a stool)? 1. Include fiber in your diet: whole grains, raw or dried fruits & vegetables, prunes, prune/pear juiceDrink at least 8 glasses of liquid (preferably water) every day 2. Avoid: ? Gas forming foods such as broccoli, beans, peas, salads, cabbage, sweet potatoes ? Greasy, fatty, or fried foods 3. Activity helps bowel function return to normal, walk around the house at least 3-4 times each day for 15 minutes or longer, if tolerated.  Rocking in a rocking chair is preferable to sitting still. 4. Stool softeners: these are not laxatives, but serve to soften  the stool to avoid straining.  Take 2-4 times a day until normal bowel function returns         Examples: Colace or generic equivalent (Docusate) 5. Bulk laxatives: provide a concentrated source of fiber.  They do not stimulate the bowel.    Take 1-2 times each day until normal bowel function return.              Examples: Citrucel, Metamucil, Fiberal, Fibercon   What can I take for "Gas Pains"? 1. Simethicone (Mylicon, Gas-X, Maalox-Gas, Mylanta-Gas) take 3-4 times a day 2. Maalox Regular - take 3-4 times a day 3. Mylanta Regular - take 3-4 times a day   What can I take if I become constipated? 1. Start with stool softeners and add additional laxatives below as needed to have a bowel movement every 1-2 days  2. Stool softeners 1-2 tablets, 2 times a day 3. Senokot 1-2 tablets, 1-2 times a day 4. Glycerin suppository can soften hard stool take once a day 5. Bisacodyl suppository once a day  6. Milk of Magnesia 30 mL 1-2 times a day 7. Fleets or tap water enema    What can I do for nausea?  1. Limit most solid foods for 24-48 hours 2. Continue eating small frequent amounts of liquids and/or bland soft foods ? Toast, crackers, cooked cereal (grits, cream of wheat, rice) 3. Benadryl: a mild anti-nausea medicine can be obtained without a prescription. May cause drowsiness, especially if taken with narcotic pain medicines 4. Contact provider for prescription nausea medication     What can I do, or take for diarrhea (more than five loose stools per day)? 1. Drink plenty of clear fluids to prevent dehydration 2. May take Kaopectate, Pepto-Bismol, Imodium, or probiotics for 1-2 days 3. Anusol or Preparation-H can be helpful for hemorrhoids and irritated tissue around anus   When should I call the doctor?             CONSTIPATION:   Not relieved after three days following the above program VOMITING:  That contains blood, "coffee ground" material  More the three times/hour and unable to  keep down nausea medication for more than eight hours  With dry mouth, dark or strong urine, feeling light-headed, dizzy, or confused  With severe abdominal pain or bloating for more than 24 hours DIARRHEA:  That continues for more then 24-48 hours despite treatment  That contains blood or tarry material  With dry mouth, dark or strong urine, feeling light~headed, dizzy, or confused FEVER:  101 F or higher along with nausea, vomiting, gas pain, diarrhea UNABLE TO:  Pass gas from rectum for more than 24 hours  Tolerate liquids by mouth for more than 24 hours        Laparoscopic Hysterectomy, Care After Refer to this sheet in the next few weeks. These instructions provide you with information on caring for yourself after your procedure. Your health care provider may also give you more specific instructions. Your treatment has been planned according to current medical practices, but problems sometimes occur. Call your health care provider if you have any problems or questions after your procedure. What can I expect after the procedure?  Pain and bruising at the incision sites. You will be given pain medicine to control it.  Menopausal symptoms such as hot flashes, night sweats, and insomnia if your ovaries were removed.  Sore throat from the breathing tube that was inserted during surgery. Follow these instructions at home:  Only take over-the-counter or prescription medicines for pain, discomfort, or fever as directed by your health care provider.  Do not take aspirin. It can cause bleeding.  Do not drive when taking pain medicine.  Follow your health care provider's advice regarding diet, exercise, lifting, driving, and general activities.    Resume your usual diet as directed and allowed.  Get plenty of rest and sleep.  Do not douche, use tampons, or have sexual intercourse for at least 6 weeks, or until your health care provider gives you permission.  Change your  bandages (dressings) as directed by your health care provider.  Monitor your temperature and notify your health care provider of a fever.  Take showers instead of baths for 2-3 weeks.  Do not drink alcohol until your health care provider gives you permission.  If you develop constipation, you may take a mild laxative with your health care provider's permission. Bran foods may help with constipation problems. Drinking enough fluids to keep your urine clear or pale yellow may help as well.  Try to have someone home with you for 1-2 weeks to help around the house.  Keep all of your follow-up appointments as directed by your health care provider. Contact a health care provider if:  You have swelling, redness, or increasing pain around your incision sites.  You have pus coming from your incision.  You notice a bad smell coming from your incision.  Your incision breaks open.  You feel dizzy or lightheaded.  You have pain or bleeding when you urinate.  You have persistent diarrhea.  You have persistent nausea and vomiting.  You have abnormal vaginal discharge.  You have a rash.  You have any type of abnormal reaction or develop an allergy to your medicine.  You have poor pain control with your prescribed medicine. Get help right away if:  You have chest pain or shortness of breath.  You have severe abdominal pain that is not relieved with pain medicine.  You have pain or swelling in your legs. This information is not intended to replace advice given to you by your health care provider. Make sure you discuss any questions you have with your health care provider. Document Released: 05/22/2013 Document Revised: 01/07/2016 Document Reviewed: 02/19/2013 Elsevier Interactive Patient Education  2017 [employer]                    

## 2020-01-15 NOTE — Progress Notes (Signed)
Patient here today for new evaluation regarding adnexal cyst. Patient reports she has pain when lying down especially on her stomach. Patient denies pain today but reports she has a constant bloated feeling.

## 2020-01-15 NOTE — H&P (View-Only) (Signed)
Gynecologic Oncology History and Physical  Referring Provider: Einar Pheasant, MD [id number] [place],  [place] [address] [phone]  Chief Concern: right ovarian cystic mass.  Subjective:  Brooke Gallagher is a 64 y.o. female who is seen in consultation from Dr. Nicki Reaper for evaluation of large right ovarian cystic mass.  She presented to Dr. Nicki Reaper with abdominal fullness and on exam had a palpable mass on 12/18/2019. At CT scan was ordered.    01/01/2020 CT ABDOMEN AND PELVIS WITH CONTRAST FINDINGS: Lower chest: No acute abnormality.  Hepatobiliary: No focal liver abnormality is seen. No gallstones, gallbladder wall thickening, or biliary dilatation.  Pancreas: Unremarkable.  Spleen: Unremarkable.  Adrenals/Urinary Tract: Adrenals are unremarkable. Mild hydroureteronephrosis, right greater than left, secondary to compression in the pelvis. Bladder is unremarkable.  Stomach/Bowel: Stomach is within normal limits. Bowel is normal in caliber. There is distal colonic diverticulosis.  Vascular/Lymphatic: Minor aortic atherosclerosis. No enlarged lymph nodes identified.  Reproductive: Septated right adnexal cystic lesion measuring approximately 13.3 x 14.2 x 19.2 cm. There is no apparent solid component. Uterus is displaced but unremarkable.  Other: No ascites.  Abdominal wall is unremarkable.  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 Surgical consultation is recommended. There is associated mild Hydroureteronephrosis.  She presents for evaluation.   Problem List: Patient Active Problem List   Diagnosis Date Noted  . Adnexal mass 01/15/2020  . Elevated blood pressure reading 12/18/2019  . Abdominal fullness 12/18/2019  . Stress 06/16/2019  .  Chest pain 06/23/2016  . Healthcare maintenance 06/23/2016  . Weight gain 02/10/2016  . Essential hypertension, benign 03/02/2013  . Hypercholesterolemia 03/02/2013  . Environmental allergies 03/02/2013  . History of colonic polyps 03/02/2013    Past Medical History: Past Medical History:  Diagnosis Date  . Allergy   . Colon polyp   . Depression   . Herpes   . Hx: UTI (urinary tract infection)   . Hyperlipidemia   . Hypertension     Past Surgical History: Past Surgical History:  Procedure Laterality Date  . [place]   lower back  . TONSILLECTOMY  1963    Past Gynecologic History:  Menarche: 11 Last Menstrual Period: n/a postmenopausal History of Abnormal pap: No Last pap: 06/13/2019 HRHPV negative/NILM Sexually active: no  OB History:  OB History  Gravida Para Term Preterm AB Living  2         2  SAB TAB Ectopic Multiple Live Births          2    # Outcome Date GA Lbr Len/2nd Weight Sex Delivery Anes PTL Lv  2 Gravida      Vag-Spont   LIV  1 Gravida      Vag-Spont   LIV    Family History: Her father had colon and prostate cancer - late onset Family History  Problem Relation Age of Onset  . Arthritis Mother   . Stroke Mother   . Hypertension Mother   . Alzheimer's disease Mother   . Dementia Mother   . Colon cancer Father   . Prostate cancer Father   . Mental illness Sister   . Alcohol abuse Brother   . Congestive Heart Failure Brother   . Colon cancer Paternal Uncle   . Colon cancer Maternal Grandmother   . Mental illness Maternal Grandmother   . Alzheimer's  disease Maternal Grandmother   . Hyperlipidemia Maternal Grandfather   . Hypertension Maternal Grandfather   . Diabetes Maternal Grandfather   . Arthritis Paternal Grandmother   . Alcohol abuse Paternal Grandfather   . Breast cancer Neg Hx     Social History: works as a Surveyor, minerals Social History   Socioeconomic History  . Marital status: Single    Spouse name: Not on file  .  Number of children: Not on file  . Years of education: Not on file  . Highest education level: Not on file  Occupational History  . Not on file  Tobacco Use  . Smoking status: Never Smoker  . Smokeless tobacco: Never Used  Substance and Sexual Activity  . Alcohol use: Yes    Alcohol/week: 1.0 - 2.0 standard drinks    Types: 1 - 2 Standard drinks or equivalent per week  . Drug use: No  . Sexual activity: Not Currently  Other Topics Concern  . Not on file  Social History Narrative  . Not on file   Social Determinants of Health   Financial Resource Strain:   . Difficulty of Paying Living Expenses:   Food Insecurity:   . Worried About Charity fundraiser in the Last Year:   . Arboriculturist in the Last Year:   Transportation Needs:   . Film/video editor (Medical):   [name] Kitchen Lack of Transportation (Non-Medical):   Physical Activity:   . Days of Exercise per Week:   . Minutes of Exercise per Session:   Stress:   . Feeling of Stress :   Social Connections:   . Frequency of Communication with Friends and Family:   . Frequency of Social Gatherings with Friends and Family:   . Attends Religious Services:   . Active Member of Clubs or Organizations:   . Attends Archivist Meetings:   [name] Kitchen Marital Status:   Intimate Partner Violence:   . Fear of Current or Ex-Partner:   . Emotionally Abused:   [name] Kitchen Physically Abused:   . Sexually Abused:     Allergies: Allergies  Allergen Reactions  . Penicillins Rash    Current Medications: Current Outpatient Medications  Medication Sig Dispense Refill  . acetaminophen (TYLENOL) 325 MG tablet Take 650 mg by mouth every 6 (six) hours as needed.    . loratadine (CLARITIN) 10 MG tablet Take 10 mg by mouth as needed for allergies.    . Melatonin 10 MG TABS Take by mouth daily.    . Multiple Vitamin (MULTIVITAMIN) tablet Take 1 tablet by mouth daily.     No current facility-administered medications for this visit.    Review of  Systems General: negative for fevers or night sweats, positive for weight gain  Skin: negative for changes in moles or sores or rash Eyes: negative for changes in vision HEENT: negative for change in hearing, tinnitus, voice changes Pulmonary: negative for dyspnea, orthopnea, productive cough, wheezing Cardiac: negative for palpitations, pain Gastrointestinal: abdominal fullness/discomfort, negative for nausea, vomiting, constipation, diarrhea, hematemesis, hematochezia Genitourinary/Sexual: urinary frequency and incontinence (not associated with stress) negative for dysuria, retention, hematuria Ob/Gyn:  negative for abnormal bleeding, or pain Musculoskeletal: negative for pain, joint pain, back pain Hematology: negative for easy bruising, abnormal bleeding Neurologic/Psych: negative for headaches, seizures, paralysis, weakness, numbness  Objective:  Physical Examination:  BP (!) 175/95 (BP Location: Left Arm, Patient Position: Sitting, Cuff Size: Normal)   Pulse 86   Temp 99.1 F (37.3 C) (Tympanic)  Wt 154 lb 1.6 oz (69.9 kg)   SpO2 100%   BMI 27.30 kg/m    ECOG Performance Status: 1 - Symptomatic but completely ambulatory  GENERAL: Patient is a well appearing female in no acute distress HEENT:  PERRL, neck supple with midline trachea. NODES:  No cervical, supraclavicular, axillary, or inguinal lymphadenopathy palpated.  LUNGS:  Clear to auscultation bilaterally.  No wheezes or rhonchi. HEART:  Regular rate and rhythm.  ABDOMEN:  Soft, distended with right LQ mass that extends from pelvis up to umbilicus; tender with deep palpation in RLQ o/w nontender. No ascites or hernia.  MSK:  No focal spinal tenderness to palpation. Full range of motion bilaterally in the lower extremities. EXTREMITIES:  No peripheral edema.   NEURO:  Nonfocal. Well oriented.  Appropriate affect.  Pelvic:chaperoned by nurse EGBUS: erythematous (per patient due to incontinence) no lesions Cervix: no  lesions, nontender, mobile Vagina: no lesions, no discharge or bleeding Uterus: unable to determine size due to mass Adnexa: palpable masses RLQ minimal mobility; left no mass; no nodularity; parametria smooth.  Rectovaginal: confirmatory no involvement with rectal mucosa  Lab Review Labs on site today: CA125, CBC, CMP, CEA, HE-4  Radiologic Imaging: As noted in HPI. CXR ordered.     Assessment:  Brooke Gallagher is a 64 y.o. female diagnosed with symptomatic large (~20 cm) ovarian cystic mass. The differential diagnosis included malignancy or benign ovarian mass.    Medical co-morbidities complicating care: HTN. Body mass index is 27.3 kg/m.   Plan:   Problem List Items Addressed This Visit      Other   Adnexal mass - Primary   Relevant Orders   CBC with Differential/Platelet   Comprehensive metabolic panel   CA 0000000   CEA   Ovarian Cancer Monitor   DG Chest 2 View    Other Visit Diagnoses    Preoperative testing       Relevant Orders   DG Chest 2 View      We discussed options for management. Based on the size of the mass, need to assess for malignancy, we recommend definitive surgical evaluation. She would definitely like to proceed with BSO but she is uncertain if she wants to proceed with hysterectomy. If she has malignancy then she would opt for hysterectomy. We discussed surgical approach - laparoscopy versus laparotomy - and risks of drainage if malignant, possible need to chemotherapy if malignant and uncontrolled drainage. We discussed the advantages and disadvantages of each approach with regard to cosmesis, length of stay and recovery time. If laparoscopy she could potentially go home the same day or the next day.  She is uncertain at this time and would like to review her tumor markers before making a decision.   Risks were discussed in detail. These include infection, anesthesia, bleeding, transfusion, wound separation, vaginal cuff dehiscence, medical issues  (blood clots, stroke, heart attack, fluid in the lungs, pneumonia, abnormal heart rhythm, death), possible exploratory surgery with larger incision, lymphedema, lymphocyst, allergic reaction, injury to adjacent organs (bowel, bladder, blood vessels, nerves, ureters, uterus).    Gyn VTE Prophylaxis Algorithm  Risk factors for VTE (calculator):  Point value Risk factors  1 point each BMI 25-30  2 points each age 53-74  3 points each none in this category  5 points each  none in this category   Major surgery (>30 min); known malignancy or concern for malignancy (elevated tumor markers); minimally invasive surgery.  Risk assessment: 0-4 total points; High risk -  heparin/UFH and SCDs (preoperative and continued while in the hospital)  Suggested return to clinic in  4-6 weeks after surgery or sooner pending her pathology results.     The patient's diagnosis, an outline of the further diagnostic and laboratory studies which will be required, the recommendation, and alternatives were discussed.  All questions were answered to the patient's satisfaction.  A total of at least 80 minutes were spent with the patient/family today; >50% was spent in education, counseling and coordination of care for symptomatic large (~20 cm) ovarian cystic mass.     Paton Crum Gaetana Michaelis, MD    CC:  Einar Pheasant, MD [id number] [place],  [place] [address] [phone]

## 2020-01-15 NOTE — H&P (Signed)
Gynecologic Oncology History and Physical  Referring Provider: Einar Pheasant, MD [id number] [place],  [place] [address] [phone]  Chief Concern: right ovarian cystic mass.  Subjective:  Brooke Gallagher is a 64 y.o. female who is seen in consultation from Dr. Nicki Reaper for evaluation of large right ovarian cystic mass.  She presented to Dr. Nicki Reaper with abdominal fullness and on exam had a palpable mass on 12/18/2019. At CT scan was ordered.    01/01/2020 CT ABDOMEN AND PELVIS WITH CONTRAST FINDINGS: Lower chest: No acute abnormality.  Hepatobiliary: No focal liver abnormality is seen. No gallstones, gallbladder wall thickening, or biliary dilatation.  Pancreas: Unremarkable.  Spleen: Unremarkable.  Adrenals/Urinary Tract: Adrenals are unremarkable. Mild hydroureteronephrosis, right greater than left, secondary to compression in the pelvis. Bladder is unremarkable.  Stomach/Bowel: Stomach is within normal limits. Bowel is normal in caliber. There is distal colonic diverticulosis.  Vascular/Lymphatic: Minor aortic atherosclerosis. No enlarged lymph nodes identified.  Reproductive: Septated right adnexal cystic lesion measuring approximately 13.3 x 14.2 x 19.2 cm. There is no apparent solid component. Uterus is displaced but unremarkable.  Other: No ascites.  Abdominal wall is unremarkable.  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 Surgical consultation is recommended. There is associated mild Hydroureteronephrosis.  She presents for evaluation.   Problem List: Patient Active Problem List   Diagnosis Date Noted  . Adnexal mass 01/15/2020  . Elevated blood pressure reading 12/18/2019  . Abdominal fullness 12/18/2019  . Stress 06/16/2019  .  Chest pain 06/23/2016  . Healthcare maintenance 06/23/2016  . Weight gain 02/10/2016  . Essential hypertension, benign 03/02/2013  . Hypercholesterolemia 03/02/2013  . Environmental allergies 03/02/2013  . History of colonic polyps 03/02/2013    Past Medical History: Past Medical History:  Diagnosis Date  . Allergy   . Colon polyp   . Depression   . Herpes   . Hx: UTI (urinary tract infection)   . Hyperlipidemia   . Hypertension     Past Surgical History: Past Surgical History:  Procedure Laterality Date  . [place]   lower back  . TONSILLECTOMY  1963    Past Gynecologic History:  Menarche: 11 Last Menstrual Period: n/a postmenopausal History of Abnormal pap: No Last pap: 06/13/2019 HRHPV negative/NILM Sexually active: no  OB History:  OB History  Gravida Para Term Preterm AB Living  2         2  SAB TAB Ectopic Multiple Live Births          2    # Outcome Date GA Lbr Len/2nd Weight Sex Delivery Anes PTL Lv  2 Gravida      Vag-Spont   LIV  1 Gravida      Vag-Spont   LIV    Family History: Her father had colon and prostate cancer - late onset Family History  Problem Relation Age of Onset  . Arthritis Mother   . Stroke Mother   . Hypertension Mother   . Alzheimer's disease Mother   . Dementia Mother   . Colon cancer Father   . Prostate cancer Father   . Mental illness Sister   . Alcohol abuse Brother   . Congestive Heart Failure Brother   . Colon cancer Paternal Uncle   . Colon cancer Maternal Grandmother   . Mental illness Maternal Grandmother   . Alzheimer's  disease Maternal Grandmother   . Hyperlipidemia Maternal Grandfather   . Hypertension Maternal Grandfather   . Diabetes Maternal Grandfather   . Arthritis Paternal Grandmother   . Alcohol abuse Paternal Grandfather   . Breast cancer Neg Hx     Social History: works as a Surveyor, minerals Social History   Socioeconomic History  . Marital status: Single    Spouse name: Not on file  .  Number of children: Not on file  . Years of education: Not on file  . Highest education level: Not on file  Occupational History  . Not on file  Tobacco Use  . Smoking status: Never Smoker  . Smokeless tobacco: Never Used  Substance and Sexual Activity  . Alcohol use: Yes    Alcohol/week: 1.0 - 2.0 standard drinks    Types: 1 - 2 Standard drinks or equivalent per week  . Drug use: No  . Sexual activity: Not Currently  Other Topics Concern  . Not on file  Social History Narrative  . Not on file   Social Determinants of Health   Financial Resource Strain:   . Difficulty of Paying Living Expenses:   Food Insecurity:   . Worried About Charity fundraiser in the Last Year:   . Arboriculturist in the Last Year:   Transportation Needs:   . Film/video editor (Medical):   [name] Kitchen Lack of Transportation (Non-Medical):   Physical Activity:   . Days of Exercise per Week:   . Minutes of Exercise per Session:   Stress:   . Feeling of Stress :   Social Connections:   . Frequency of Communication with Friends and Family:   . Frequency of Social Gatherings with Friends and Family:   . Attends Religious Services:   . Active Member of Clubs or Organizations:   . Attends Archivist Meetings:   [name] Kitchen Marital Status:   Intimate Partner Violence:   . Fear of Current or Ex-Partner:   . Emotionally Abused:   [name] Kitchen Physically Abused:   . Sexually Abused:     Allergies: Allergies  Allergen Reactions  . Penicillins Rash    Current Medications: Current Outpatient Medications  Medication Sig Dispense Refill  . acetaminophen (TYLENOL) 325 MG tablet Take 650 mg by mouth every 6 (six) hours as needed.    . loratadine (CLARITIN) 10 MG tablet Take 10 mg by mouth as needed for allergies.    . Melatonin 10 MG TABS Take by mouth daily.    . Multiple Vitamin (MULTIVITAMIN) tablet Take 1 tablet by mouth daily.     No current facility-administered medications for this visit.    Review of  Systems General: negative for fevers or night sweats, positive for weight gain  Skin: negative for changes in moles or sores or rash Eyes: negative for changes in vision HEENT: negative for change in hearing, tinnitus, voice changes Pulmonary: negative for dyspnea, orthopnea, productive cough, wheezing Cardiac: negative for palpitations, pain Gastrointestinal: abdominal fullness/discomfort, negative for nausea, vomiting, constipation, diarrhea, hematemesis, hematochezia Genitourinary/Sexual: urinary frequency and incontinence (not associated with stress) negative for dysuria, retention, hematuria Ob/Gyn:  negative for abnormal bleeding, or pain Musculoskeletal: negative for pain, joint pain, back pain Hematology: negative for easy bruising, abnormal bleeding Neurologic/Psych: negative for headaches, seizures, paralysis, weakness, numbness  Objective:  Physical Examination:  BP (!) 175/95 (BP Location: Left Arm, Patient Position: Sitting, Cuff Size: Normal)   Pulse 86   Temp 99.1 F (37.3 C) (Tympanic)  Wt 154 lb 1.6 oz (69.9 kg)   SpO2 100%   BMI 27.30 kg/m    ECOG Performance Status: 1 - Symptomatic but completely ambulatory  GENERAL: Patient is a well appearing female in no acute distress HEENT:  PERRL, neck supple with midline trachea. NODES:  No cervical, supraclavicular, axillary, or inguinal lymphadenopathy palpated.  LUNGS:  Clear to auscultation bilaterally.  No wheezes or rhonchi. HEART:  Regular rate and rhythm.  ABDOMEN:  Soft, distended with right LQ mass that extends from pelvis up to umbilicus; tender with deep palpation in RLQ o/w nontender. No ascites or hernia.  MSK:  No focal spinal tenderness to palpation. Full range of motion bilaterally in the lower extremities. EXTREMITIES:  No peripheral edema.   NEURO:  Nonfocal. Well oriented.  Appropriate affect.  Pelvic:chaperoned by nurse EGBUS: erythematous (per patient due to incontinence) no lesions Cervix: no  lesions, nontender, mobile Vagina: no lesions, no discharge or bleeding Uterus: unable to determine size due to mass Adnexa: palpable masses RLQ minimal mobility; left no mass; no nodularity; parametria smooth.  Rectovaginal: confirmatory no involvement with rectal mucosa  Lab Review Labs on site today: CA125, CBC, CMP, CEA, HE-4  Radiologic Imaging: As noted in HPI. CXR ordered.     Assessment:  Brooke Gallagher is a 64 y.o. female diagnosed with symptomatic large (~20 cm) ovarian cystic mass. The differential diagnosis included malignancy or benign ovarian mass.    Medical co-morbidities complicating care: HTN. Body mass index is 27.3 kg/m.   Plan:   Problem List Items Addressed This Visit      Other   Adnexal mass - Primary   Relevant Orders   CBC with Differential/Platelet   Comprehensive metabolic panel   CA 0000000   CEA   Ovarian Cancer Monitor   DG Chest 2 View    Other Visit Diagnoses    Preoperative testing       Relevant Orders   DG Chest 2 View      We discussed options for management. Based on the size of the mass, need to assess for malignancy, we recommend definitive surgical evaluation. She would definitely like to proceed with BSO but she is uncertain if she wants to proceed with hysterectomy. If she has malignancy then she would opt for hysterectomy. We discussed surgical approach - laparoscopy versus laparotomy - and risks of drainage if malignant, possible need to chemotherapy if malignant and uncontrolled drainage. We discussed the advantages and disadvantages of each approach with regard to cosmesis, length of stay and recovery time. If laparoscopy she could potentially go home the same day or the next day.  She is uncertain at this time and would like to review her tumor markers before making a decision.   Risks were discussed in detail. These include infection, anesthesia, bleeding, transfusion, wound separation, vaginal cuff dehiscence, medical issues  (blood clots, stroke, heart attack, fluid in the lungs, pneumonia, abnormal heart rhythm, death), possible exploratory surgery with larger incision, lymphedema, lymphocyst, allergic reaction, injury to adjacent organs (bowel, bladder, blood vessels, nerves, ureters, uterus).    Gyn VTE Prophylaxis Algorithm  Risk factors for VTE (calculator):  Point value Risk factors  1 point each BMI 25-30  2 points each age 57-74  3 points each none in this category  5 points each  none in this category   Major surgery (>30 min); known malignancy or concern for malignancy (elevated tumor markers); minimally invasive surgery.  Risk assessment: 0-4 total points; High risk -  heparin/UFH and SCDs (preoperative and continued while in the hospital)  Suggested return to clinic in  4-6 weeks after surgery or sooner pending her pathology results.     The patient's diagnosis, an outline of the further diagnostic and laboratory studies which will be required, the recommendation, and alternatives were discussed.  All questions were answered to the patient's satisfaction.  A total of at least 80 minutes were spent with the patient/family today; >50% was spent in education, counseling and coordination of care for symptomatic large (~20 cm) ovarian cystic mass.     Noele Icenhour Gaetana Michaelis, MD    CC:  Einar Pheasant, MD [id number] [place],  [place] [address] [phone]

## 2020-01-15 NOTE — Progress Notes (Signed)
Gynecologic Oncology Consult Visit   Referring Provider: Einar Pheasant, MD [id number] [place],  [place] [address] [phone]  Chief Concern: right ovarian cystic mass.  Subjective:  Brooke Gallagher is a 64 y.o. female who is seen in consultation from Dr. Nicki Reaper for evaluation of large right ovarian cystic mass.  She presented to Dr. Nicki Reaper with abdominal fullness and on exam had a palpable mass on 12/18/2019. At CT scan was ordered.    01/01/2020 CT ABDOMEN AND PELVIS WITH CONTRAST FINDINGS: Lower chest: No acute abnormality.  Hepatobiliary: No focal liver abnormality is seen. No gallstones, gallbladder wall thickening, or biliary dilatation.  Pancreas: Unremarkable.  Spleen: Unremarkable.  Adrenals/Urinary Tract: Adrenals are unremarkable. Mild hydroureteronephrosis, right greater than left, secondary to compression in the pelvis. Bladder is unremarkable.  Stomach/Bowel: Stomach is within normal limits. Bowel is normal in caliber. There is distal colonic diverticulosis.  Vascular/Lymphatic: Minor aortic atherosclerosis. No enlarged lymph nodes identified.  Reproductive: Septated right adnexal cystic lesion measuring approximately 13.3 x 14.2 x 19.2 cm. There is no apparent solid component. Uterus is displaced but unremarkable.  Other: No ascites.  Abdominal wall is unremarkable.  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 Surgical consultation is recommended. There is associated mild Hydroureteronephrosis.  She presents for evaluation.   Problem List: Patient Active Problem List   Diagnosis Date Noted  . Adnexal mass 01/15/2020  . Elevated blood pressure reading 12/18/2019  . Abdominal fullness 12/18/2019  . Stress 06/16/2019  . Chest  pain 06/23/2016  . Healthcare maintenance 06/23/2016  . Weight gain 02/10/2016  . Essential hypertension, benign 03/02/2013  . Hypercholesterolemia 03/02/2013  . Environmental allergies 03/02/2013  . History of colonic polyps 03/02/2013    Past Medical History: Past Medical History:  Diagnosis Date  . Allergy   . Colon polyp   . Depression   . Herpes   . Hx: UTI (urinary tract infection)   . Hyperlipidemia   . Hypertension     Past Surgical History: Past Surgical History:  Procedure Laterality Date  . [place]   lower back  . TONSILLECTOMY  1963    Past Gynecologic History:  Menarche: 11 Last Menstrual Period: n/a postmenopausal History of Abnormal pap: No Last pap: 06/13/2019 HRHPV negative/NILM Sexually active: no  OB History:  OB History  Gravida Para Term Preterm AB Living  2         2  SAB TAB Ectopic Multiple Live Births          2    # Outcome Date GA Lbr Len/2nd Weight Sex Delivery Anes PTL Lv  2 Gravida      Vag-Spont   LIV  1 Gravida      Vag-Spont   LIV    Family History: Her father had colon and prostate cancer - late onset Family History  Problem Relation Age of Onset  . Arthritis Mother   . Stroke Mother   . Hypertension Mother   . Alzheimer's disease Mother   . Dementia Mother   . Colon cancer Father   . Prostate cancer Father   . Mental illness Sister   . Alcohol abuse Brother   . Congestive Heart Failure Brother   . Colon cancer Paternal Uncle   . Colon cancer Maternal Grandmother   . Mental illness Maternal Grandmother   . Alzheimer's  disease Maternal Grandmother   . Hyperlipidemia Maternal Grandfather   . Hypertension Maternal Grandfather   . Diabetes Maternal Grandfather   . Arthritis Paternal Grandmother   . Alcohol abuse Paternal Grandfather   . Breast cancer Neg Hx     Social History: works as a Surveyor, minerals Social History   Socioeconomic History  . Marital status: Single    Spouse name: Not on file  . Number of  children: Not on file  . Years of education: Not on file  . Highest education level: Not on file  Occupational History  . Not on file  Tobacco Use  . Smoking status: Never Smoker  . Smokeless tobacco: Never Used  Substance and Sexual Activity  . Alcohol use: Yes    Alcohol/week: 1.0 - 2.0 standard drinks    Types: 1 - 2 Standard drinks or equivalent per week  . Drug use: No  . Sexual activity: Not Currently  Other Topics Concern  . Not on file  Social History Narrative  . Not on file   Social Determinants of Health   Financial Resource Strain:   . Difficulty of Paying Living Expenses:   Food Insecurity:   . Worried About Charity fundraiser in the Last Year:   . Arboriculturist in the Last Year:   Transportation Needs:   . Film/video editor (Medical):   [name] Kitchen Lack of Transportation (Non-Medical):   Physical Activity:   . Days of Exercise per Week:   . Minutes of Exercise per Session:   Stress:   . Feeling of Stress :   Social Connections:   . Frequency of Communication with Friends and Family:   . Frequency of Social Gatherings with Friends and Family:   . Attends Religious Services:   . Active Member of Clubs or Organizations:   . Attends Archivist Meetings:   [name] Kitchen Marital Status:   Intimate Partner Violence:   . Fear of Current or Ex-Partner:   . Emotionally Abused:   [name] Kitchen Physically Abused:   . Sexually Abused:     Allergies: Allergies  Allergen Reactions  . Penicillins Rash    Current Medications: Current Outpatient Medications  Medication Sig Dispense Refill  . acetaminophen (TYLENOL) 325 MG tablet Take 650 mg by mouth every 6 (six) hours as needed.    . loratadine (CLARITIN) 10 MG tablet Take 10 mg by mouth as needed for allergies.    . Melatonin 10 MG TABS Take by mouth daily.    . Multiple Vitamin (MULTIVITAMIN) tablet Take 1 tablet by mouth daily.     No current facility-administered medications for this visit.    Review of  Systems General: negative for fevers or night sweats, positive for weight gain  Skin: negative for changes in moles or sores or rash Eyes: negative for changes in vision HEENT: negative for change in hearing, tinnitus, voice changes Pulmonary: negative for dyspnea, orthopnea, productive cough, wheezing Cardiac: negative for palpitations, pain Gastrointestinal: abdominal fullness/discomfort, negative for nausea, vomiting, constipation, diarrhea, hematemesis, hematochezia Genitourinary/Sexual: urinary frequency and incontinence (not associated with stress) negative for dysuria, retention, hematuria Ob/Gyn:  negative for abnormal bleeding, or pain Musculoskeletal: negative for pain, joint pain, back pain Hematology: negative for easy bruising, abnormal bleeding Neurologic/Psych: negative for headaches, seizures, paralysis, weakness, numbness  Objective:  Physical Examination:  BP (!) 175/95 (BP Location: Left Arm, Patient Position: Sitting, Cuff Size: Normal)   Pulse 86   Temp 99.1 F (37.3 C) (Tympanic)  Wt 154 lb 1.6 oz (69.9 kg)   SpO2 100%   BMI 27.30 kg/m    ECOG Performance Status: 1 - Symptomatic but completely ambulatory  GENERAL: Patient is a well appearing female in no acute distress HEENT:  PERRL, neck supple with midline trachea. NODES:  No cervical, supraclavicular, axillary, or inguinal lymphadenopathy palpated.  LUNGS:  Clear to auscultation bilaterally.  No wheezes or rhonchi. HEART:  Regular rate and rhythm.  ABDOMEN:  Soft, distended with right LQ mass that extends from pelvis up to umbilicus; tender with deep palpation in RLQ o/w nontender. No ascites or hernia.  MSK:  No focal spinal tenderness to palpation. Full range of motion bilaterally in the lower extremities. EXTREMITIES:  No peripheral edema.   NEURO:  Nonfocal. Well oriented.  Appropriate affect.  Pelvic:chaperoned by nurse EGBUS: erythematous (per patient due to incontinence) no lesions Cervix: no  lesions, nontender, mobile Vagina: no lesions, no discharge or bleeding Uterus: unable to determine size due to mass Adnexa: palpable masses RLQ minimal mobility; left no mass; no nodularity; parametria smooth.  Rectovaginal: confirmatory no involvement with rectal mucosa  Lab Review Labs on site today: CA125, CBC, CMP, CEA, HE-4  Radiologic Imaging: As noted in HPI. CXR ordered.     Assessment:  Brooke Gallagher is a 64 y.o. female diagnosed with symptomatic large (~20 cm) ovarian cystic mass. The differential diagnosis included malignancy or benign ovarian mass.    Medical co-morbidities complicating care: HTN. Body mass index is 27.3 kg/m.   Plan:   Problem List Items Addressed This Visit      Other   Adnexal mass - Primary   Relevant Orders   CBC with Differential/Platelet   Comprehensive metabolic panel   CA 0000000   CEA   Ovarian Cancer Monitor   DG Chest 2 View    Other Visit Diagnoses    Preoperative testing       Relevant Orders   DG Chest 2 View      We discussed options for management. Based on the size of the mass, need to assess for malignancy, we recommend definitive surgical evaluation. She would definitely like to proceed with BSO but she is uncertain if she wants to proceed with hysterectomy. If she has malignancy then she would opt for hysterectomy. We discussed surgical approach - laparoscopy versus laparotomy - and risks of drainage if malignant, possible need to chemotherapy if malignant and uncontrolled drainage. We discussed the advantages and disadvantages of each approach with regard to cosmesis, length of stay and recovery time. If laparoscopy she could potentially go home the same day or the next day.  She is uncertain at this time and would like to review her tumor markers before making a decision.   Risks were discussed in detail. These include infection, anesthesia, bleeding, transfusion, wound separation, vaginal cuff dehiscence, medical issues  (blood clots, stroke, heart attack, fluid in the lungs, pneumonia, abnormal heart rhythm, death), possible exploratory surgery with larger incision, lymphedema, lymphocyst, allergic reaction, injury to adjacent organs (bowel, bladder, blood vessels, nerves, ureters, uterus).    Gyn VTE Prophylaxis Algorithm  Risk factors for VTE (calculator):  Point value Risk factors  1 point each BMI 25-30  2 points each age 45-74  3 points each none in this category  5 points each  none in this category   Major surgery (>30 min); known malignancy or concern for malignancy (elevated tumor markers); minimally invasive surgery.  Risk assessment: 0-4 total points; High risk -  heparin/UFH and SCDs (preoperative and continued while in the hospital)  Suggested return to clinic in  4-6 weeks after surgery or sooner pending her pathology results.     The patient's diagnosis, an outline of the further diagnostic and laboratory studies which will be required, the recommendation, and alternatives were discussed.  All questions were answered to the patient's satisfaction.  A total of at least 80 minutes were spent with the patient/family today; >50% was spent in education, counseling and coordination of care for symptomatic large (~20 cm) ovarian cystic mass.     Mckenzye Cutright Gaetana Michaelis, MD    CC:  Einar Pheasant, MD [id number] [place],  [place] [address] [phone]

## 2020-01-16 ENCOUNTER — Inpatient Hospital Stay: Payer: Medicare Other

## 2020-01-16 ENCOUNTER — Encounter: Payer: Self-pay | Admitting: Obstetrics and Gynecology

## 2020-01-16 ENCOUNTER — Other Ambulatory Visit: Payer: Self-pay

## 2020-01-16 DIAGNOSIS — N9489 Other specified conditions associated with female genital organs and menstrual cycle: Secondary | ICD-10-CM

## 2020-01-16 DIAGNOSIS — D3911 Neoplasm of uncertain behavior of right ovary: Secondary | ICD-10-CM | POA: Diagnosis not present

## 2020-01-16 LAB — CA 125: Cancer Antigen (CA) 125: 30.8 U/mL (ref 0.0–38.1)

## 2020-01-16 LAB — CEA: CEA: 1.5 ng/mL (ref 0.0–4.7)

## 2020-01-18 LAB — HUMAN EPIDIDYMIS PROT 4,SERIAL: HE4: 52.5 pmol/L (ref 0.0–96.5)

## 2020-01-21 ENCOUNTER — Other Ambulatory Visit
Admission: RE | Admit: 2020-01-21 | Discharge: 2020-01-21 | Disposition: A | Discharge status: Home / Self Care | Payer: Medicare Other | Source: Ambulatory Visit | Attending: Obstetrics and Gynecology | Admitting: Obstetrics and Gynecology

## 2020-01-21 ENCOUNTER — Other Ambulatory Visit: Payer: Self-pay

## 2020-01-21 DIAGNOSIS — Z20822 Contact with and (suspected) exposure to covid-19: Secondary | ICD-10-CM | POA: Diagnosis not present

## 2020-01-21 DIAGNOSIS — Z01812 Encounter for preprocedural laboratory examination: Secondary | ICD-10-CM | POA: Diagnosis present

## 2020-01-21 LAB — TYPE AND SCREEN
ABO/RH(D): A NEG
Antibody Screen: NEGATIVE

## 2020-01-21 MED ORDER — GENTAMICIN SULFATE 40 MG/ML IJ SOLN
5.0000 mg/kg | INTRAVENOUS | Status: AC
  Administered 2020-01-22: 350 mg via INTRAVENOUS
  Filled 2020-01-21: qty 8.75

## 2020-01-21 MED ORDER — CLINDAMYCIN PHOSPHATE 900 MG/50ML IV SOLN
900.0000 mg | INTRAVENOUS | Status: AC
  Administered 2020-01-22: 900 mg via INTRAVENOUS

## 2020-01-22 ENCOUNTER — Ambulatory Visit
Admission: RE | Admit: 2020-01-22 | Discharge: 2020-01-22 | Disposition: A | Discharge status: Home / Self Care | Payer: Medicare Other | Attending: Obstetrics and Gynecology | Admitting: Obstetrics and Gynecology

## 2020-01-22 ENCOUNTER — Inpatient Hospital Stay: Payer: Medicare Other | Admitting: Anesthesiology

## 2020-01-22 ENCOUNTER — Encounter
Admission: RE | Disposition: A | Discharge status: Home / Self Care | Payer: Self-pay | Source: Home / Self Care | Attending: Obstetrics and Gynecology

## 2020-01-22 ENCOUNTER — Ambulatory Visit: Payer: Medicare Other | Admitting: Internal Medicine

## 2020-01-22 ENCOUNTER — Encounter: Payer: Self-pay | Admitting: Obstetrics and Gynecology

## 2020-01-22 DIAGNOSIS — D271 Benign neoplasm of left ovary: Secondary | ICD-10-CM | POA: Diagnosis not present

## 2020-01-22 DIAGNOSIS — I10 Essential (primary) hypertension: Secondary | ICD-10-CM | POA: Insufficient documentation

## 2020-01-22 DIAGNOSIS — N802 Endometriosis of fallopian tube: Secondary | ICD-10-CM | POA: Insufficient documentation

## 2020-01-22 DIAGNOSIS — R1900 Intra-abdominal and pelvic swelling, mass and lump, unspecified site: Secondary | ICD-10-CM | POA: Diagnosis present

## 2020-01-22 DIAGNOSIS — Z880 Allergy status to penicillin: Secondary | ICD-10-CM | POA: Diagnosis not present

## 2020-01-22 DIAGNOSIS — N1330 Unspecified hydronephrosis: Secondary | ICD-10-CM | POA: Insufficient documentation

## 2020-01-22 DIAGNOSIS — N736 Female pelvic peritoneal adhesions (postinfective): Secondary | ICD-10-CM | POA: Insufficient documentation

## 2020-01-22 DIAGNOSIS — Z87440 Personal history of urinary (tract) infections: Secondary | ICD-10-CM | POA: Insufficient documentation

## 2020-01-22 DIAGNOSIS — N838 Other noninflammatory disorders of ovary, fallopian tube and broad ligament: Secondary | ICD-10-CM | POA: Insufficient documentation

## 2020-01-22 DIAGNOSIS — N9489 Other specified conditions associated with female genital organs and menstrual cycle: Secondary | ICD-10-CM

## 2020-01-22 HISTORY — PX: LAPAROSCOPIC BILATERAL SALPINGO OOPHERECTOMY: SHX5890

## 2020-01-22 LAB — SARS CORONAVIRUS 2 (TAT 6-24 HRS): SARS Coronavirus 2: NEGATIVE

## 2020-01-22 LAB — ABO/RH: ABO/RH(D): A NEG

## 2020-01-22 SURGERY — SALPINGO-OOPHORECTOMY, BILATERAL, LAPAROSCOPIC
Anesthesia: General | Laterality: Bilateral

## 2020-01-22 MED ORDER — PROMETHAZINE HCL 25 MG/ML IJ SOLN
INTRAMUSCULAR | Status: AC
  Administered 2020-01-22: 6.25 mg via INTRAVENOUS
  Filled 2020-01-22: qty 1

## 2020-01-22 MED ORDER — FAMOTIDINE 20 MG PO TABS: 20.0000 mg | ORAL_TABLET | Freq: Once | ORAL | Status: AC

## 2020-01-22 MED ORDER — LIDOCAINE HCL (CARDIAC) PF 100 MG/5ML IV SOSY
PREFILLED_SYRINGE | INTRAVENOUS | Status: DC | PRN
  Administered 2020-01-22: 80 mg via INTRAVENOUS

## 2020-01-22 MED ORDER — CLINDAMYCIN PHOSPHATE 900 MG/50ML IV SOLN
INTRAVENOUS | Status: AC
  Filled 2020-01-22: qty 50

## 2020-01-22 MED ORDER — PROPOFOL 10 MG/ML IV BOLUS
INTRAVENOUS | Status: DC | PRN
  Administered 2020-01-22: 160 mg via INTRAVENOUS

## 2020-01-22 MED ORDER — PROPOFOL 10 MG/ML IV BOLUS
INTRAVENOUS | Status: AC
  Filled 2020-01-22: qty 20

## 2020-01-22 MED ORDER — FAMOTIDINE 20 MG PO TABS
ORAL_TABLET | ORAL | Status: AC
  Administered 2020-01-22: 20 mg via ORAL
  Filled 2020-01-22: qty 1

## 2020-01-22 MED ORDER — KETAMINE HCL 50 MG/ML IJ SOLN
INTRAMUSCULAR | Status: AC
  Filled 2020-01-22: qty 10

## 2020-01-22 MED ORDER — HEPARIN SODIUM (PORCINE) 5000 UNIT/ML IJ SOLN: 5000.0000 [IU] | INTRAMUSCULAR | Status: AC

## 2020-01-22 MED ORDER — HEPARIN SODIUM (PORCINE) 5000 UNIT/ML IJ SOLN
INTRAMUSCULAR | Status: AC
  Administered 2020-01-22: 5000 [IU] via SUBCUTANEOUS
  Filled 2020-01-22: qty 1

## 2020-01-22 MED ORDER — ACETAMINOPHEN 500 MG PO TABS
1000.0000 mg | ORAL_TABLET | Freq: Four times a day (QID) | ORAL | 0 refills | Status: AC
Start: 2020-01-22 — End: 2020-01-25

## 2020-01-22 MED ORDER — FENTANYL CITRATE (PF) 100 MCG/2ML IJ SOLN
INTRAMUSCULAR | Status: AC
  Administered 2020-01-22: 25 ug via INTRAVENOUS
  Filled 2020-01-22: qty 2

## 2020-01-22 MED ORDER — ONDANSETRON HCL 4 MG/2ML IJ SOLN: 4.0000 mg | Freq: Once | INTRAMUSCULAR | Status: AC

## 2020-01-22 MED ORDER — MIDAZOLAM HCL 2 MG/2ML IJ SOLN
INTRAMUSCULAR | Status: AC
  Filled 2020-01-22: qty 2

## 2020-01-22 MED ORDER — ACETAMINOPHEN 500 MG PO TABS: 1000.0000 mg | ORAL_TABLET | ORAL | Status: AC

## 2020-01-22 MED ORDER — BUPIVACAINE HCL (PF) 0.5 % IJ SOLN
INTRAMUSCULAR | Status: DC | PRN
  Administered 2020-01-22: 10 mL

## 2020-01-22 MED ORDER — MIDAZOLAM HCL 2 MG/2ML IJ SOLN
INTRAMUSCULAR | Status: DC | PRN
  Administered 2020-01-22 (×2): 1 mg via INTRAVENOUS

## 2020-01-22 MED ORDER — ORAL CARE MOUTH RINSE: 15.0000 mL | Freq: Once | OROMUCOSAL | Status: AC

## 2020-01-22 MED ORDER — CELECOXIB 200 MG PO CAPS: 400.0000 mg | ORAL_CAPSULE | ORAL | Status: AC

## 2020-01-22 MED ORDER — FENTANYL CITRATE (PF) 100 MCG/2ML IJ SOLN
25.0000 ug | INTRAMUSCULAR | Status: AC | PRN
  Administered 2020-01-22 (×4): 25 ug via INTRAVENOUS

## 2020-01-22 MED ORDER — OXYCODONE HCL 5 MG PO TABS: 5.0000 mg | ORAL_TABLET | ORAL | 0 refills | Status: DC | PRN

## 2020-01-22 MED ORDER — GABAPENTIN 800 MG PO TABS: 800.0000 mg | ORAL_TABLET | Freq: Every day | ORAL | 0 refills | Status: DC

## 2020-01-22 MED ORDER — ONDANSETRON HCL 4 MG/2ML IJ SOLN
INTRAMUSCULAR | Status: AC
  Filled 2020-01-22: qty 2

## 2020-01-22 MED ORDER — FENTANYL CITRATE (PF) 100 MCG/2ML IJ SOLN
INTRAMUSCULAR | Status: AC
  Filled 2020-01-22: qty 2

## 2020-01-22 MED ORDER — PHENYLEPHRINE HCL (PRESSORS) 10 MG/ML IV SOLN
INTRAVENOUS | Status: DC | PRN
  Administered 2020-01-22: 150 ug via INTRAVENOUS
  Administered 2020-01-22 (×4): 200 ug via INTRAVENOUS
  Administered 2020-01-22 (×2): 100 ug via INTRAVENOUS

## 2020-01-22 MED ORDER — DOCUSATE SODIUM 100 MG PO CAPS
100.0000 mg | ORAL_CAPSULE | Freq: Two times a day (BID) | ORAL | 0 refills | Status: DC
Start: 2020-01-22 — End: 2020-10-09

## 2020-01-22 MED ORDER — ROCURONIUM BROMIDE 100 MG/10ML IV SOLN
INTRAVENOUS | Status: DC | PRN
  Administered 2020-01-22: 40 mg via INTRAVENOUS
  Administered 2020-01-22 (×2): 20 mg via INTRAVENOUS

## 2020-01-22 MED ORDER — CHLORHEXIDINE GLUCONATE 0.12 % MT SOLN
OROMUCOSAL | Status: AC
  Administered 2020-01-22: 15 mL via OROMUCOSAL
  Filled 2020-01-22: qty 15

## 2020-01-22 MED ORDER — DEXAMETHASONE SODIUM PHOSPHATE 10 MG/ML IJ SOLN
INTRAMUSCULAR | Status: AC
  Filled 2020-01-22: qty 1

## 2020-01-22 MED ORDER — CHLORHEXIDINE GLUCONATE 0.12 % MT SOLN: 15.0000 mL | Freq: Once | OROMUCOSAL | Status: AC

## 2020-01-22 MED ORDER — IBUPROFEN 800 MG PO TABS
800.0000 mg | ORAL_TABLET | Freq: Three times a day (TID) | ORAL | 1 refills | Status: AC
Start: 2020-01-22 — End: 2020-01-25

## 2020-01-22 MED ORDER — OXYCODONE HCL 5 MG PO TABS
5.0000 mg | ORAL_TABLET | ORAL | Status: DC | PRN
  Administered 2020-01-22: 5 mg via ORAL

## 2020-01-22 MED ORDER — ACETAMINOPHEN 500 MG PO TABS
ORAL_TABLET | ORAL | Status: AC
  Administered 2020-01-22: 1000 mg via ORAL
  Filled 2020-01-22: qty 2

## 2020-01-22 MED ORDER — DEXAMETHASONE SODIUM PHOSPHATE 10 MG/ML IJ SOLN
INTRAMUSCULAR | Status: DC | PRN
  Administered 2020-01-22: 10 mg via INTRAVENOUS

## 2020-01-22 MED ORDER — SODIUM CHLORIDE FLUSH 0.9 % IV SOLN
INTRAVENOUS | Status: AC
  Filled 2020-01-22: qty 10

## 2020-01-22 MED ORDER — PROMETHAZINE HCL 25 MG/ML IJ SOLN: 6.2500 mg | INTRAMUSCULAR | Status: DC | PRN

## 2020-01-22 MED ORDER — BUPIVACAINE HCL (PF) 0.5 % IJ SOLN
INTRAMUSCULAR | Status: AC
  Filled 2020-01-22: qty 30

## 2020-01-22 MED ORDER — KETAMINE HCL 50 MG/ML IJ SOLN
INTRAMUSCULAR | Status: DC | PRN
Start: 2020-01-22 — End: 2020-01-22
  Administered 2020-01-22 (×2): 25 mg via INTRAMUSCULAR

## 2020-01-22 MED ORDER — SUGAMMADEX SODIUM 200 MG/2ML IV SOLN
INTRAVENOUS | Status: DC | PRN
  Administered 2020-01-22: 200 mg via INTRAVENOUS

## 2020-01-22 MED ORDER — CELECOXIB 200 MG PO CAPS
ORAL_CAPSULE | ORAL | Status: AC
  Administered 2020-01-22: 400 mg via ORAL
  Filled 2020-01-22: qty 2

## 2020-01-22 MED ORDER — OXYCODONE HCL 5 MG PO TABS
ORAL_TABLET | ORAL | Status: AC
  Filled 2020-01-22: qty 1

## 2020-01-22 MED ORDER — ONDANSETRON HCL 4 MG/2ML IJ SOLN
INTRAMUSCULAR | Status: DC | PRN
  Administered 2020-01-22: 4 mg via INTRAVENOUS

## 2020-01-22 MED ORDER — SOD CITRATE-CITRIC ACID 500-334 MG/5ML PO SOLN
30.0000 mL | ORAL | Status: DC
  Filled 2020-01-22: qty 30

## 2020-01-22 MED ORDER — ONDANSETRON HCL 4 MG/2ML IJ SOLN
INTRAMUSCULAR | Status: AC
  Administered 2020-01-22: 4 mg via INTRAVENOUS
  Filled 2020-01-22: qty 2

## 2020-01-22 MED ORDER — FENTANYL CITRATE (PF) 100 MCG/2ML IJ SOLN
INTRAMUSCULAR | Status: DC | PRN
  Administered 2020-01-22 (×2): 50 ug via INTRAVENOUS

## 2020-01-22 MED ORDER — LACTATED RINGERS IV SOLN: INTRAVENOUS | Status: DC

## 2020-01-22 SURGICAL SUPPLY — 93 items
APPLICATOR SURGIFLO ENDO (HEMOSTASIS) IMPLANT
BAG URINE DRAIN 2000ML AR STRL (UROLOGICAL SUPPLIES) ×3 IMPLANT
BLADE SURG 15 STRL LF DISP TIS (BLADE) ×2 IMPLANT
BLADE SURG 15 STRL SS (BLADE) ×1
CANISTER SUCT 1200ML W/VALVE (MISCELLANEOUS) ×3 IMPLANT
CANNULA DILATOR 5 W/SLV (CANNULA) ×5 IMPLANT
CATH FOLEY 2WAY  5CC 16FR (CATHETERS) ×1
CATH URTH 16FR FL 2W BLN LF (CATHETERS) ×2 IMPLANT
CHLORAPREP W/TINT 26 (MISCELLANEOUS) ×3 IMPLANT
CNTNR SPEC 2.5X3XGRAD LEK (MISCELLANEOUS) ×2
CONT SPEC 4OZ STER OR WHT (MISCELLANEOUS) ×1
CONTAINER SPEC 2.5X3XGRAD LEK (MISCELLANEOUS) ×2 IMPLANT
CORD MONOPOLAR M/FML 12FT (MISCELLANEOUS) ×3 IMPLANT
COVER WAND RF STERILE (DRAPES) ×3 IMPLANT
DEFOGGER SCOPE WARMER CLEARIFY (MISCELLANEOUS) ×3 IMPLANT
DERMABOND ADVANCED (GAUZE/BANDAGES/DRESSINGS) ×1
DERMABOND ADVANCED .7 DNX12 (GAUZE/BANDAGES/DRESSINGS) ×2 IMPLANT
DEVICE SUTURE ENDOST 10MM (ENDOMECHANICALS) ×1 IMPLANT
DRAPE LAP W/FLUID (DRAPES) ×1 IMPLANT
DRAPE LAPAROTOMY 100X77 ABD (DRAPES) ×1 IMPLANT
DRAPE LAPAROTOMY TRNSV 106X77 (MISCELLANEOUS) ×1 IMPLANT
DRAPE PERI LITHO V/GYN (MISCELLANEOUS) ×1 IMPLANT
DRAPE STERI POUCH LG 24X46 STR (DRAPES) ×6 IMPLANT
DRAPE UNDER BUTTOCK W/FLU (DRAPES) ×3 IMPLANT
DRSG TELFA 3X8 NADH (GAUZE/BANDAGES/DRESSINGS) IMPLANT
ELECT BLADE 6 FLAT ULTRCLN (ELECTRODE) ×1 IMPLANT
ELECT CAUTERY BLADE 6.4 (BLADE) ×3 IMPLANT
ELECT REM PT RETURN 9FT ADLT (ELECTROSURGICAL) ×3
ELECTRODE REM PT RTRN 9FT ADLT (ELECTROSURGICAL) ×2 IMPLANT
GAUZE SPONGE 4X4 12PLY STRL (GAUZE/BANDAGES/DRESSINGS) ×1 IMPLANT
GLOVE BIO SURGEON STRL SZ8 (GLOVE) ×12 IMPLANT
GLOVE INDICATOR 8.0 STRL GRN (GLOVE) ×5 IMPLANT
GOWN STRL REUS W/ TWL LRG LVL3 (GOWN DISPOSABLE) ×8 IMPLANT
GOWN STRL REUS W/ TWL XL LVL3 (GOWN DISPOSABLE) ×2 IMPLANT
GOWN STRL REUS W/TWL LRG LVL3 (GOWN DISPOSABLE) ×6
GOWN STRL REUS W/TWL XL LVL3 (GOWN DISPOSABLE) ×1
GRASPER SUT TROCAR 14GX15 (MISCELLANEOUS) ×3 IMPLANT
IRRIGATION STRYKERFLOW (MISCELLANEOUS) ×1 IMPLANT
IRRIGATOR STRYKERFLOW (MISCELLANEOUS) ×3
IV LACTATED RINGERS 1000ML (IV SOLUTION) ×3 IMPLANT
KIT IMAGING PINPOINTPAQ (MISCELLANEOUS) IMPLANT
KIT PINK PAD W/HEAD ARE REST (MISCELLANEOUS) ×3
KIT PINK PAD W/HEAD ARM REST (MISCELLANEOUS) ×2 IMPLANT
KIT TURNOVER CYSTO (KITS) ×1 IMPLANT
LABEL OR SOLS (LABEL) ×1 IMPLANT
LIGASURE IMPACT 36 18CM CVD LR (INSTRUMENTS) IMPLANT
LIGASURE VESSEL 5MM BLUNT TIP (ELECTROSURGICAL) ×3 IMPLANT
MANIPULATOR VCARE LG CRV RETR (MISCELLANEOUS) IMPLANT
MANIPULATOR VCARE SML CRV RETR (MISCELLANEOUS) IMPLANT
MANIPULATOR VCARE STD CRV RETR (MISCELLANEOUS) IMPLANT
NDL INSUFF ACCESS 14 VERSASTEP (NEEDLE) ×3 IMPLANT
NDL SPNL 22GX5 LNG QUINC BK (NEEDLE) ×1 IMPLANT
NEEDLE SPNL 22GX5 LNG QUINC BK (NEEDLE) IMPLANT
NS IRRIG 1000ML POUR BTL (IV SOLUTION) ×1 IMPLANT
NS IRRIG 500ML POUR BTL (IV SOLUTION) ×3 IMPLANT
OCCLUDER COLPOPNEUMO (BALLOONS) ×3 IMPLANT
PACK BASIN MAJOR (MISCELLANEOUS) ×1 IMPLANT
PACK GYN LAPAROSCOPIC (MISCELLANEOUS) ×3 IMPLANT
PAD DRESSING TELFA 3X8 NADH (GAUZE/BANDAGES/DRESSINGS) ×1 IMPLANT
PAD OB MATERNITY 4.3X12.25 (PERSONAL CARE ITEMS) ×3 IMPLANT
PAD PREP 24X41 OB/GYN DISP (PERSONAL CARE ITEMS) ×3 IMPLANT
PORT ACCESS TROCAR AIRSEAL 5 (TROCAR) ×3 IMPLANT
POUCH ENDO CATCH II 15MM (MISCELLANEOUS) ×2 IMPLANT
SET CYSTO W/LG BORE CLAMP LF (SET/KITS/TRAYS/PACK) ×3 IMPLANT
SET TRI-LUMEN FLTR TB AIRSEAL (TUBING) ×3 IMPLANT
SHEARS ENDO 5MM 31CM (CUTTER) ×3 IMPLANT
SPOGE SURGIFLO 8M (HEMOSTASIS)
SPONGE LAP 18X18 RF (DISPOSABLE) ×1 IMPLANT
SPONGE LAP 4X18 RFD (DISPOSABLE) IMPLANT
SPONGE SURGIFLO 8M (HEMOSTASIS) IMPLANT
STAPLER SKIN PROX 35W (STAPLE) ×3 IMPLANT
SUT ENDO VLOC 180-0-8IN (SUTURE) ×1 IMPLANT
SUT MAXON ABS #0 GS21 30IN (SUTURE) ×2 IMPLANT
SUT MNCRL 4-0 (SUTURE) ×1
SUT MNCRL 4-0 27XMFL (SUTURE) ×2
SUT PDS AB 1 TP1 96 (SUTURE) ×2 IMPLANT
SUT PLAIN 2 0 XLH (SUTURE) ×1 IMPLANT
SUT VIC AB 0 CT1 27 (SUTURE)
SUT VIC AB 0 CT1 27XCR 8 STRN (SUTURE) ×4 IMPLANT
SUT VIC AB 0 CT1 36 (SUTURE) ×1 IMPLANT
SUT VIC AB 2-0 UR6 27 (SUTURE) ×3 IMPLANT
SUT VICRYL AB 3-0 FS1 BRD 27IN (SUTURE) ×1 IMPLANT
SUTURE MNCRL 4-0 27XMF (SUTURE) ×3 IMPLANT
SYR 10ML LL (SYRINGE) ×3 IMPLANT
SYR 20ML LL LF (SYRINGE) IMPLANT
SYR 3ML LL SCALE MARK (SYRINGE) ×6 IMPLANT
SYR 50ML LL SCALE MARK (SYRINGE) ×6 IMPLANT
SYR BULB IRRIG 60ML STRL (SYRINGE) ×3 IMPLANT
TRAY FOLEY MTR SLVR 16FR STAT (SET/KITS/TRAYS/PACK) ×1 IMPLANT
TROCAR BLUNT TIP 12MM OMST12BT (TROCAR) ×1 IMPLANT
TROCAR VERSASTEP PLUS 12MM (TROCAR) ×3 IMPLANT
TROCAR VERSASTEP PLUS 5MM (TROCAR) ×4 IMPLANT
TUBING EVAC SMOKE HEATED PNEUM (TUBING) ×1 IMPLANT

## 2020-01-22 NOTE — Op Note (Signed)
Operative Note   01/22/2020 1:14 PM  PRE-OP DIAGNOSIS: Large pelvic mass   POST-OP DIAGNOSIS: Benign left ovarian tumor  SURGEON: Surgeon(s) and Role:    Mellody Drown, MD - Primary    * Benjaman Kindler, MD  ANESTHESIA: General   PROCEDURE: Procedure(s): LAPAROSCOPIC BILATERAL SALPINGO OOPHORECTOMY, LYSIS OF ADHESIONS   ESTIMATED BLOOD LOSS: less than 50 mL  DRAINS: NONE  TOTAL IV FLUIDS: PER ANESTH  SPECIMENS: BILATERAL TUBES AND OVARIES AND LEFT OVARIAN MASS  COMPLICATIONS: NONE  DISPOSITION: PACU - hemodynamically stable.  CONDITION: stable  INDICATIONS: Large cystic ovarian mass with normal tumor markers.   FINDINGS: Large lobutated smooth mass arising from left ovary.  There were significant adhesions of the sigmoid colon to the the left PSW and mass laterally and posteriorly.  Right adnexa normal.  Uterus with a few very small myomas.  Upper abdomen normal.   Frozen section of the mass was benign.    PROCEDURE IN DETAIL: After informed consent was obtained, the patient was taken to the operating room where anesthesia was obtained without difficulty. The patient was positioned in the dorsal lithotomy position in [place] and her arms were carefully tucked at her sides and the usual precautions were taken.  She was prepped and draped in normal sterile fashion.  Time-out was performed and a Foley catheter was placed into the bladder.    A small vertical skin  incision of about 4 cm was made below the umbilicus and the fascia opened to reveal the mass.  A 5 mm step trochar was placed in the mass and about 2 L of fluid was evacuated via suction.  The incision in the mass was then closed with 2-0 VIcryl running suture.  We then placed the Hasson infraumbilical 13-KG baloon trocar through this incision under direct visualization and the balloon was inflated. The laparoscope was introduced and CO2 gas was infused for pneumoperitoneum to a pressure of 15 mm Hg.  Right  and suprapubic 5 mm ports were placed under direct visualization of the laparoscope using an EndoStep technique.  The 5 mm Airseal port was placed on the left.     There was still a major part of the mass that had not been decompressed.  The patient was placed in Trendelenburg and the bowel was displaced up into the upper abdomen.  Round ligament was divided on the left with the EndoShears and the retroperitoneal space was opened.  After considerable dissection of the left PSW peritoneum and sigmoid reflection and some adhesions the ureter was identified at the pelvic brim and preserved.  The infundibulopelvic ligament was skeletonized, sealed and divided with the LigaSure device.  We then detatched the left adnexa and mass from from the uterus with the Ligasure.  At this point there were still considerable adhesions between the mass and the posterior peritoneum and sigmoid colon, which was plastered to the left PSW below where the IP ligament was running.  At this point we used the needle trochar to drain an additional large cystic component of the mass with minimal spillage.  The adhesions posteriorly were filmy and easily taken down sharply.  The adhesions to the sigmoid and left PSW were a bit thicker and were taken down carefully with the endoshears.  One thick adhesion of the mass to the sigmoid was taken down using the Ligasure, but we were not right next to the colon.  There was no concern for injury to the colon and it was inspected carefully  after the mass was finally released.  We attempted to place the mass in an endocatch bag, but this was difficult because the umbilical incision was low and there was not enough room between the mass and the bag to see well.  In view of this and the benign appearance of the mass we grabbed the mass up through the umbilical incision and morcellated it in a few big pieces.  Frozen section was benign.    The right tube and ovary were then removed.  The lateral  peritoneum was opened and the ureter identified.  Then the IP was skeletonized and transected with the Ligasure.  The posterior peritoneum was then taken down with the endoshears and the Ligasure was used to transect the adnexa from the uterus.  The specimen was also delivered with instruments through the umbilical incision. The pelvis was irrigated extensively.     Hemostasis was observed. The intraperitoneal pressure was dropped, and all planes of dissection and vascular pedicles were found to be hemostatic.  The suprapubic trocar was removed.  The right and left trocars were closed with 0 Vicryl suture using the Endoclose technique.  Before the umbilical trocar was removed the CO2 gas was released.  The fascia there was closed with 2-0 Vicryl suture with a running technique since the incision was slightly larger than usual due to the mass drainage and delivery via that site.   Running sutures were started in each angle with attention to including good fascia in each bite and tied together in the midline.  Attention was paid to avoiding the underlying intestines.    The skin incisions were closed with subcuticular stitches and glue.  The patient tolerated the procedure well.  Sponge, lap and needle counts were correct x2.  The patient was taken to recovery room in excellent condition.  I spoke to the patient's daughter after the surgery and described the procedure.  Antibiotics: given preop   VTE prophylaxis: ordered perioperatively.   Mellody Drown, MD

## 2020-01-22 NOTE — Interval H&P Note (Signed)
History and Physical Interval Note:  01/22/2020 9:51 AM  Brooke Gallagher  has presented today for surgery, with the diagnosis of Adnexal cyst.  The various methods of treatment have been discussed with the patient and family. After consideration of risks, benefits and other options for treatment, the patient has consented to  Procedure(s): LAPAROSCOPIC BILATERAL SALPINGO OOPHORECTOMY (Bilateral) HYSTERECTOMY TOTAL LAPAROSCOPIC (N/A) LAPAROTOMY (N/A) PELVIC LYMPH NODE DISSECTION, POSSIBLE BIOPSIES, POSSIBLE OMENTECTOMY, POSSIBLE BOWEL SURGERY (N/A) as a surgical intervention.  The patient's history has been reviewed, patient examined, no change in status, stable for surgery.  I have reviewed the patient's chart and labs.  Questions were answered to the patient's satisfaction.    Patient seen and examined.  Patient has decided that she does not want hysterectomy unless medically indicated by frozen section. Will plan for decompression of mass and LS BSO in view of high likelihood of benign path based on tumor markers and cystic appearance.   Mellody Drown

## 2020-01-22 NOTE — Discharge Instructions (Signed)
Laparoscopic Ovarian Surgery Discharge Instructions  For the next three days, take ibuprofen and acetaminophen on a schedule, every 8 hours. You can take them together or you can intersperse them, and take one every four hours. I also gave you gabapentin for nighttime, to help you sleep and also to control pain. Take gabapentin medicines at night for at least the next 3 nights. You also have a narcotic, oxycodone, to take as needed if the above medicines don't help.  Postop constipation is a major cause of pain. Stay well hydrated, walk as you tolerate, and take over the counter senna as well as stool softeners if you need them.   RISKS AND COMPLICATIONS   Infection.  Bleeding.  Injury to surrounding organs.  Anesthetic side effects.   PROCEDURE   You may be given a medicine to help you relax (sedative) before the procedure. You will be given a medicine to make you sleep (general anesthetic) during the procedure.  A tube will be put down your throat to help your breath while under general anesthesia.  Several small cuts (incisions) are made in the lower abdominal area and one incision is made near the belly button.  Your abdominal area will be inflated with a safe gas (carbon dioxide). This helps give the surgeon room to operate, visualize, and helps the surgeon avoid other organs.  A thin, lighted tube (laparoscope) with a camera attached is inserted into your abdomen through the incision near the belly button. Other small instruments may also be inserted through other abdominal incisions.  The ovary is located and are removed.  After the ovary is removed, the gas is released from the abdomen.  The incisions will be closed with stitches (sutures), and Dermabond. A bandage may be placed over the incisions.  AFTER THE PROCEDURE   You will also have some mild abdominal discomfort for 3-7 days. You will be given pain medicine to ease any discomfort.  As long as there are no  problems, you may be allowed to go home. Someone will need to drive you home and be with you for at least 24 hours once home.  You may have some mild discomfort in the throat. This is from the tube placed in your throat while you were sleeping.  You may experience discomfort in the shoulder area from some trapped air between the liver and diaphragm. This sensation is normal and will slowly go away on its own.  HOME CARE INSTRUCTIONS   Take all medicines as directed.  Only take over-the-counter or prescription medicines for pain, discomfort, or fever as directed by your caregiver.  Resume daily activities as directed.  Showers are preferred over baths for 2 weeks.  You may resume sexual activities in 1 week or as you feel you would like to.  Do not drive while taking narcotics.  SEEK MEDICAL CARE IF: .  There is increasing abdominal pain.  You feel lightheaded or faint.  You have the chills.  You have an oral temperature above 102 F (38.9 C).  There is pus-like (purulent) drainage from any of the wounds.  You are unable to pass gas or have a bowel movement.  You feel sick to your stomach (nauseous) or throw up (vomit) and can't control it with your medicines.  MAKE SURE YOU:   Understand these instructions.  Will watch your condition.  Will get help right away if you are not doing well or get worse.  [employer] Patient Information 2013 [employer]     AMBULATORY SURGERY  DISCHARGE INSTRUCTIONS   1) The drugs that you were given will stay in your system until tomorrow so for the next 24 hours you should not:  A) Drive an automobile B) Make any legal decisions C) Drink any alcoholic beverage   2) You may resume regular meals tomorrow.  Today it is better to start with liquids and gradually work up to solid foods.  You may eat anything you prefer, but it is better to start with liquids, then soup and crackers, and gradually work up to solid  foods.   3) Please notify your doctor immediately if you have any unusual bleeding, trouble breathing, redness and pain at the surgery site, drainage, fever, or pain not relieved by medication.    4) Additional Instructions:        Please contact your physician with any problems or Same Day Surgery at [phone], Monday through Friday 6 am to 4 pm, or [place] [facility] [place] [facility] number at [phone].   

## 2020-01-22 NOTE — Anesthesia Postprocedure Evaluation (Signed)
Anesthesia Post Note  Patient: Brooke Gallagher  Procedure(s) Performed: LAPAROSCOPIC BILATERAL SALPINGO OOPHORECTOMY (Bilateral )  Patient location during evaluation: PACU Anesthesia Type: General Level of consciousness: awake and alert Pain management: pain level controlled Vital Signs Assessment: post-procedure vital signs reviewed and stable Respiratory status: spontaneous breathing, nonlabored ventilation, respiratory function stable and patient connected to nasal cannula oxygen Cardiovascular status: blood pressure returned to baseline and stable Postop Assessment: no apparent nausea or vomiting Anesthetic complications: no     Last Vitals:  Vitals:   01/22/20 1434 01/22/20 1435  BP: 124/73   Pulse: 80 80  Resp: 10 10  Temp:    SpO2: (P) 91% 94%    Last Pain:  Vitals:   01/22/20 1434  TempSrc:   PainSc: Asleep                 Precious Haws Caitlen Worth

## 2020-01-22 NOTE — Progress Notes (Signed)
Pt shivering. States she is not cold. Dr. Amie Critchley notified. Acknowledged. Orders received.

## 2020-01-22 NOTE — Anesthesia Preprocedure Evaluation (Signed)
Anesthesia Evaluation  Patient identified by MRN, date of birth, ID band Patient awake    Reviewed: Allergy & Precautions, H&P , NPO status , Patient's Chart, lab work & pertinent test results, reviewed documented beta blocker date and time   History of Anesthesia Complications Negative for: history of anesthetic complications  Airway Mallampati: I  TM Distance: >3 FB Neck ROM: full    Dental  (+) Dental Advidsory Given, Caps, Missing, Teeth Intact   Pulmonary neg pulmonary ROS,    Pulmonary exam normal breath sounds clear to auscultation       Cardiovascular Exercise Tolerance: Good hypertension, (-) angina(-) Past MI and (-) Cardiac Stents Normal cardiovascular exam(-) dysrhythmias (-) Valvular Problems/Murmurs Rhythm:regular Rate:Normal     Neuro/Psych PSYCHIATRIC DISORDERS Depression negative neurological ROS     GI/Hepatic negative GI ROS, Neg liver ROS,   Endo/Other  negative endocrine ROS  Renal/GU negative Renal ROS  negative genitourinary   Musculoskeletal   Abdominal   Peds  Hematology negative hematology ROS (+)   Anesthesia Other Findings Past Medical History: No date: Allergy No date: Colon polyp No date: Depression No date: Herpes No date: Hx: UTI (urinary tract infection) No date: Hyperlipidemia No date: Hypertension   Reproductive/Obstetrics negative OB ROS                             Anesthesia Physical Anesthesia Plan  ASA: II  Anesthesia Plan: General   Post-op Pain Management:    Induction: Intravenous  PONV Risk Score and Plan: 3 and Ondansetron, Dexamethasone, Midazolam and Treatment may vary due to age or medical condition  Airway Management Planned: Oral ETT  Additional Equipment:   Intra-op Plan:   Post-operative Plan: Extubation in OR  Informed Consent: I have reviewed the patients History and Physical, chart, labs and discussed the procedure  including the risks, benefits and alternatives for the proposed anesthesia with the patient or authorized representative who has indicated his/her understanding and acceptance.     Dental Advisory Given  Plan Discussed with: Anesthesiologist, CRNA and Surgeon  Anesthesia Plan Comments:         Anesthesia Quick Evaluation

## 2020-01-22 NOTE — Anesthesia Procedure Notes (Signed)
Procedure Name: Intubation Date/Time: 01/22/2020 11:15 AM Performed by: Allean Found, CRNA Pre-anesthesia Checklist: Patient identified, Patient being monitored, Timeout performed, Emergency Drugs available and Suction available Patient Re-evaluated:Patient Re-evaluated prior to induction Oxygen Delivery Method: Circle system utilized Preoxygenation: Pre-oxygenation with 100% oxygen Induction Type: IV induction Ventilation: Mask ventilation without difficulty Laryngoscope Size: 3 and McGraph Grade View: Grade I Tube type: Oral Tube size: 7.0 mm Number of attempts: 1 Airway Equipment and Method: Stylet Placement Confirmation: ETT inserted through vocal cords under direct vision,  positive ETCO2 and breath sounds checked- equal and bilateral Secured at: 21 cm Tube secured with: Tape Dental Injury: Teeth and Oropharynx as per pre-operative assessment

## 2020-01-22 NOTE — Transfer of Care (Signed)
Immediate Anesthesia Transfer of Care Note  Patient: Brooke Gallagher  Procedure(s) Performed: LAPAROSCOPIC BILATERAL SALPINGO OOPHORECTOMY (Bilateral )  Patient Location: PACU  Anesthesia Type:General  Level of Consciousness: sedated  Airway & Oxygen Therapy: Patient Spontanous Breathing and Patient connected to face mask oxygen  Post-op Assessment: Report given to RN and Post -op Vital signs reviewed and stable  Post vital signs: Reviewed and stable  Last Vitals:  Vitals Value Taken Time  BP 141/83 01/22/20 1319  Temp 36.4 C 01/22/20 1319  Pulse 79 01/22/20 1323  Resp 18 01/22/20 1323  SpO2 100 % 01/22/20 1323  Vitals shown include unvalidated device data.  Last Pain:  Vitals:   01/22/20 1319  TempSrc:   PainSc: Asleep         Complications: No apparent anesthesia complications

## 2020-01-22 NOTE — Progress Notes (Signed)
   01/22/20 0700  Clinical Encounter Type  Visited With Patient;Health care provider  Visit Type Initial  Referral From Chaplain;Nurse  Consult/Referral To Chaplain  Chaplain briefly spoke with patient. Chaplain commented on her facemask which says "Be Kind." Chaplain told patient that just a few minutes ago that she thanked someone for being kind to her. Patient said, that is why I wear it. Chaplain asked if she could pray with her and patient said yes. Patient is scheduled to be admitted, but patient thinks she is going home, therefore chaplain prayed asking God to allow all to go well and allow patient to be discharged today. Chaplain will follow up.

## 2020-01-23 ENCOUNTER — Encounter: Payer: Self-pay | Admitting: Obstetrics and Gynecology

## 2020-01-24 LAB — SURGICAL PATHOLOGY

## 2020-02-25 NOTE — Progress Notes (Signed)
[place]  [phone] [phone]  Patient Care Team: Einar Pheasant, MD as PCP - General (Internal Medicine) Clent Jacks, RN as Oncology Nurse Navigator   Name of the patient: Brooke Gallagher  [id number]  [birth year]   Date of visit: 02/26/2020  Gynecologic Oncology Consult Visit   Referring Provider: Einar Pheasant, MD [address] [place],  [place] [address] [phone]  Chief Concern: post op visit after laparoscopic BSO for serous cystadenoma of left ovary.  Subjective:  Brooke Gallagher is a 64 y.o. female who is seen in consultation from Dr. Nicki Reaper for evaluation of large right ovarian cystic mass, now s/p laparoscopic bilateral salpingo oopherectomy, and lysis of adhesions (without hysterectomy) on 01/22/2020 with Dr. Fransisca Connors and Dr. Leafy Ro. She presents today for post-operative evaluation.   01/22/2020 PATHOLOGY DIAGNOSIS:  A. FALLOPIAN TUBE AND OVARY, LEFT; SALPINGO-OOPHORECTOMY:  - BENIGN SEROUS CYSTADENOMA.  - BACKGROUND OVARIAN PARENCHYMA WITH BENIGN PHYSIOLOGIC CHANGES.  - BENIGN FALLOPIAN TUBE PARATUBAL CYST; OTHERWISE NO SIGNIFICANT  HISTOPATHOLOGIC CHANGE.  - NEGATIVE FOR MALIGNANCY.   B. FALLOPIAN TUBE AND OVARY, RIGHT; SALPINGO-OOPHORECTOMY:  - OVARY WITH BENIGN PHYSIOLOGIC CHANGES.  - FALLOPIAN TUBE WITH ENDOMETRIOSIS AND BENIGN PARATUBAL CYSTS.  - NEGATIVE FOR MALIGNANCY.   Her postoperative course has been unremarkable.   Today she reports some mild tenderness of abdomen. No GI, GU complaints.   GYN ONC History 12/18/2019 She originally presented to Dr. Nicki Reaper with abdominal fullness and on exam had a palpable mass on 12/18/2019. Subsequently a CT scan was ordered.   01/01/2020 CT ABDOMEN AND PELVIS WITH CONTRAST Large multilocular right adnexal cystic lesion (13.3 x 14.2 x 19.2cm) without apparent solid component likely reflecting mucinous or serous cystadenoma.  Surgical consultation is recommended. There is associated mild Hydroureteronephrosis.  Problem List: Patient Active Problem List   Diagnosis Date Noted  . Adnexal mass 01/15/2020  . Elevated blood pressure reading 12/18/2019  . Abdominal fullness 12/18/2019  . Stress 06/16/2019  . Chest pain 06/23/2016  . Healthcare maintenance 06/23/2016  . Weight gain 02/10/2016  . Essential hypertension, benign 03/02/2013  . Hypercholesterolemia 03/02/2013  . Environmental allergies 03/02/2013  . History of colonic polyps 03/02/2013    Past Medical History: Past Medical History:  Diagnosis Date  . Allergy   . Colon polyp   . Depression   . Herpes   . Hx: UTI (urinary tract infection)   . Hyperlipidemia   . Hypertension     Past Surgical History: Past Surgical History:  Procedure Laterality Date  . [place]   lower back  . LAPAROSCOPIC BILATERAL SALPINGO OOPHERECTOMY Bilateral 01/22/2020   Procedure: LAPAROSCOPIC BILATERAL SALPINGO OOPHORECTOMY;  Surgeon: Mellody Drown, MD;  Location: [facility] ORS;  Service: Gynecology;  Laterality: Bilateral;  . TONSILLECTOMY  1963    Past Gynecologic History:  Menarche: 11 Last Menstrual Period: n/a postmenopausal History of Abnormal pap: No Last pap: 06/13/2019 HRHPV negative/NILM Sexually active: no  OB History:  OB History  Gravida Para Term Preterm AB Living  2         2  SAB TAB Ectopic Multiple Live Births          2    # Outcome Date GA Lbr Len/2nd Weight Sex Delivery Anes PTL Lv  2 Gravida      Vag-Spont   LIV  1 Gravida      Vag-Spont   LIV    Family History: Her father had colon  and prostate cancer - late onset Family History  Problem Relation Age of Onset  . Arthritis Mother   . Stroke Mother   . Hypertension Mother   . Alzheimer's disease Mother   . Dementia Mother   . Colon cancer Father   . Prostate cancer Father   . Mental illness Sister   . Alcohol abuse Brother   . Congestive Heart Failure Brother     . Colon cancer Paternal Uncle   . Colon cancer Maternal Grandmother   . Mental illness Maternal Grandmother   . Alzheimer's disease Maternal Grandmother   . Hyperlipidemia Maternal Grandfather   . Hypertension Maternal Grandfather   . Diabetes Maternal Grandfather   . Arthritis Paternal Grandmother   . Alcohol abuse Paternal Grandfather   . Breast cancer Neg Hx     Social History: works as a Surveyor, minerals Social History   Socioeconomic History  . Marital status: Single    Spouse name: Not on file  . Number of children: Not on file  . Years of education: Not on file  . Highest education level: Not on file  Occupational History  . Not on file  Tobacco Use  . Smoking status: Never Smoker  . Smokeless tobacco: Never Used  Vaping Use  . Vaping Use: Never used  Substance and Sexual Activity  . Alcohol use: Yes    Alcohol/week: 1.0 - 2.0 standard drink    Types: 1 - 2 Standard drinks or equivalent per week  . Drug use: No  . Sexual activity: Not Currently  Other Topics Concern  . Not on file  Social History Narrative  . Not on file   Social Determinants of Health   Financial Resource Strain:   . Difficulty of Paying Living Expenses:   Food Insecurity:   . Worried About Charity fundraiser in the Last Year:   . Arboriculturist in the Last Year:   Transportation Needs:   . Film/video editor (Medical):   [name] Kitchen Lack of Transportation (Non-Medical):   Physical Activity:   . Days of Exercise per Week:   . Minutes of Exercise per Session:   Stress:   . Feeling of Stress :   Social Connections:   . Frequency of Communication with Friends and Family:   . Frequency of Social Gatherings with Friends and Family:   . Attends Religious Services:   . Active Member of Clubs or Organizations:   . Attends Archivist Meetings:   [name] Kitchen Marital Status:   Intimate Partner Violence:   . Fear of Current or Ex-Partner:   . Emotionally Abused:   [name] Kitchen Physically Abused:   . Sexually  Abused:    Allergies: Allergies  Allergen Reactions  . Penicillins Rash   Current Medications: Current Outpatient Medications  Medication Sig Dispense Refill  . docusate sodium (COLACE) 100 MG capsule Take 1 capsule (100 mg total) by mouth 2 (two) times daily. To keep stools soft 30 capsule 0  . gabapentin (NEURONTIN) 800 MG tablet Take 1 tablet (800 mg total) by mouth at bedtime for 14 days. Take nightly for 3 days, then up to 14 days as needed 14 tablet 0  . loratadine (CLARITIN) 10 MG tablet Take 10 mg by mouth daily.     . Melatonin 10 MG TABS Take 20 mg by mouth at bedtime as needed (sleep).     . Multiple Vitamin (MULTIVITAMIN WITH MINERALS) TABS tablet Take 1 tablet by mouth daily. Women's Multivitamin    .  oxyCODONE (OXY IR/ROXICODONE) 5 MG immediate release tablet Take 1 tablet (5 mg total) by mouth every 4 (four) hours as needed for severe pain. 20 tablet 0   No current facility-administered medications for this visit.    Review of Systems General: no complaints  HEENT: no complaints  Lungs: no complaints  Cardiac: no complaints  GI: no complaints  GU: no complaints  Musculoskeletal: no complaints  Extremities: no complaints  Skin: no complaints  Neuro: no complaints  Endocrine: no complaints  Psych: no complaints       Objective:  Physical Examination:  BP (!) 154/93 (BP Location: Left Arm, Patient Position: Sitting, Cuff Size: Normal)   Pulse 78   Temp 98.6 F (37 C) (Tympanic)   Resp 18   Wt 149 lb 8 oz (67.8 kg)   SpO2 100%   BMI 26.48 kg/m    ECOG Performance Status: 1 - Symptomatic but completely ambulatory  EXAM 01/22/2020 GENERAL: Patient is a well appearing female in no acute distress HEENT:  PERRL, neck supple with midline trachea. NODES:  No cervical, supraclavicular, axillary, or inguinal lymphadenopathy palpated.  LUNGS:  Clear to auscultation bilaterally.  No wheezes or rhonchi. HEART:  Regular rate and rhythm.  ABDOMEN:  Soft, incisions  are well healed.  MSK:  No focal spinal tenderness to palpation. Full range of motion bilaterally in the lower extremities. EXTREMITIES:  No peripheral edema.   NEURO:  Nonfocal. Well oriented.  Appropriate affect.  Pelvic:chaperoned by nurse EGBUS: erythematous (per patient due to incontinence) no lesions Cervix: no lesions, nontender, mobile Vagina: no lesions, no discharge or bleeding Uterus: normal Adnexa:  No masses Rectovaginal:deferred  Radiologic Imaging: As noted in HPI.      Assessment:  Brooke Gallagher is a 64 y.o. female diagnosed with symptomatic large (~20 cm) ovarian cystic mass. She she now s/p laparopscpic BSO on 01/22/2020 for benign serous cystadenoma.  Normal post op exam.  Medical co-morbidities complicating care: HTN.   Plan:   Problem List Items Addressed This Visit      Other   Adnexal mass - Primary     Patient may resume normal activities.  Since the mass was benign will follow up with her regular physicians for care.    Brooke Gallagher, Student FNP  I personally had a face to face interaction and evaluated the patient jointly with the NP Student, Mrs. Brooke Gallagher.  I have reviewed her history and available records and have performed the key portions of the physical exam including general, HEENT, abdominal exam, pelvic exam with my findings confirming those documented above by the APP student.  I have discussed the case with the APP student and the patient.  I agree with the above documentation, assessment and plan which was fully formulated by me.  Counseling was completed by me.   COSIGNER:   Mellody Drown, MD   CC:  Einar Pheasant, MD [address] [place],   [address] [phone]

## 2020-02-26 ENCOUNTER — Other Ambulatory Visit: Payer: Self-pay

## 2020-02-26 ENCOUNTER — Inpatient Hospital Stay: Payer: Medicare Other | Attending: Obstetrics and Gynecology | Admitting: Obstetrics and Gynecology

## 2020-02-26 ENCOUNTER — Inpatient Hospital Stay: Payer: Medicare Other

## 2020-02-26 VITALS — BP 154/93 | HR 78 | Temp 98.6°F | Resp 18 | Wt 149.5 lb

## 2020-02-26 DIAGNOSIS — N9489 Other specified conditions associated with female genital organs and menstrual cycle: Secondary | ICD-10-CM

## 2020-02-26 DIAGNOSIS — Z90722 Acquired absence of ovaries, bilateral: Secondary | ICD-10-CM | POA: Insufficient documentation

## 2020-02-26 DIAGNOSIS — D271 Benign neoplasm of left ovary: Secondary | ICD-10-CM

## 2020-03-04 ENCOUNTER — Ambulatory Visit: Payer: Medicare Other

## 2020-03-04 ENCOUNTER — Telehealth: Payer: Self-pay | Admitting: Internal Medicine

## 2020-03-04 NOTE — Telephone Encounter (Signed)
Pt wanted to know if she is due for a Tdap

## 2020-03-05 NOTE — Telephone Encounter (Signed)
Patient had a Td in 2010. Discussed with Dr Nicki Reaper. She is going to get the Tdap at her pharmacy.

## 2020-03-30 ENCOUNTER — Other Ambulatory Visit: Payer: Self-pay

## 2020-03-30 ENCOUNTER — Ambulatory Visit
Admission: RE | Admit: 2020-03-30 | Discharge: 2020-03-30 | Disposition: A | Discharge status: Home / Self Care | Payer: Medicare Other | Source: Ambulatory Visit | Attending: Internal Medicine | Admitting: Internal Medicine

## 2020-03-30 DIAGNOSIS — Z1231 Encounter for screening mammogram for malignant neoplasm of breast: Secondary | ICD-10-CM | POA: Insufficient documentation

## 2020-05-29 ENCOUNTER — Telehealth: Payer: Self-pay

## 2020-05-29 NOTE — Telephone Encounter (Signed)
I called the patient and she stated she goes in to work at 2 pm she would have to do a morning visit.  Parminder Cupples,cma

## 2020-05-29 NOTE — Telephone Encounter (Signed)
Pt's dad passed away last week and she has not been sleeping. Pt needs something called in to help her sleep. She is asking for a call back.

## 2020-05-29 NOTE — Telephone Encounter (Signed)
Do you want to add her on somewhere or are you ok with sending something in if she does not want a visit?

## 2020-05-29 NOTE — Telephone Encounter (Signed)
I can do a virtual visit at 4:30 today if pt agreeable.  (can discuss and see what is needed).  Let me know if a problem.

## 2020-05-30 NOTE — Telephone Encounter (Signed)
Called pt this am to discuss her sleep issues and medication.  Unable to reach her.  Left message for her to call.  Please call and confirm doing ok.  I can work her in somewhere if needed.

## 2020-06-01 NOTE — Telephone Encounter (Signed)
PT SCHEDULED. She is dong okay and has slept better the last few nights. She is going to do a virtual with PCP on Thursday to discuss referral to a therapist because the last couple of years have been hard. Patient declined appt offered for today.

## 2020-06-01 NOTE — Telephone Encounter (Signed)
LMTCB

## 2020-06-03 NOTE — Telephone Encounter (Signed)
Pt called and cancelled appt for 10/21  [facility] that she felt like the appt and referral was not needed at this time

## 2020-06-04 ENCOUNTER — Telehealth: Payer: Medicare Other | Admitting: Internal Medicine

## 2020-10-08 ENCOUNTER — Telehealth: Payer: Self-pay

## 2020-10-08 NOTE — Telephone Encounter (Signed)
Spoken to patient, she stated she has been having higher readings for the past few months, She does not have any SOB, Heart palpitation, blurry vision, chest pain, and arm/jaw px. Appointment has been scheduled tomorrow with Aurora Mask, FNP. Patient did stated that she feels more tired than usual through out the days.

## 2020-10-08 NOTE — Telephone Encounter (Signed)
Brooke Gallagher with [facility] in [place] called to set up blood pressure appt with PCP. Pt is in their office this morning and bp was 160/102. Pt states that her bp has been running that high for a couple of months. There are no appts avail with PCP for a while at the time of call. Please call back to advise [phone]

## 2020-10-08 NOTE — Telephone Encounter (Signed)
Please call to triage her to see how soon she needs to be seen

## 2020-10-09 ENCOUNTER — Encounter: Payer: Self-pay | Admitting: Family

## 2020-10-09 ENCOUNTER — Other Ambulatory Visit: Payer: Self-pay

## 2020-10-09 ENCOUNTER — Ambulatory Visit (INDEPENDENT_AMBULATORY_CARE_PROVIDER_SITE_OTHER): Payer: Medicare Other | Admitting: Family

## 2020-10-09 VITALS — BP 152/96 | HR 82 | Temp 97.6°F | Ht 63.0 in | Wt 154.8 lb

## 2020-10-09 DIAGNOSIS — I10 Essential (primary) hypertension: Secondary | ICD-10-CM

## 2020-10-09 DIAGNOSIS — F4321 Adjustment disorder with depressed mood: Secondary | ICD-10-CM | POA: Diagnosis not present

## 2020-10-09 LAB — BASIC METABOLIC PANEL
BUN: 14 mg/dL (ref 6–23)
CO2: 32 mEq/L (ref 19–32)
Calcium: 9.7 mg/dL (ref 8.4–10.5)
Chloride: 101 mEq/L (ref 96–112)
Creatinine, Ser: 0.76 mg/dL (ref 0.40–1.20)
GFR: 81.4 mL/min (ref >60.00)
Glucose, Bld: 86 mg/dL (ref 70–99)
Potassium: 4.2 mEq/L (ref 3.5–5.1)
Sodium: 139 mEq/L (ref 135–145)

## 2020-10-09 MED ORDER — TRAZODONE HCL 50 MG PO TABS: 25.0000 mg | ORAL_TABLET | Freq: Every evening | ORAL | 3 refills | Status: DC | PRN

## 2020-10-09 MED ORDER — LISINOPRIL 10 MG PO TABS: 10.0000 mg | ORAL_TABLET | Freq: Every day | ORAL | 3 refills | Status: DC

## 2020-10-09 NOTE — Progress Notes (Signed)
Subjective:    Patient ID: Brooke Gallagher, female    DOB: [birth year], 65 y.o.   MRN: [id number]  CC: Brooke Gallagher is a 65 y.o. female who presents today for an acute visit.    HPI:  Blood pressure has been high at home 140-150/ 80-100. Denies CP, sob, HA, sudden vision changes, face or arm numbness. She has been on lisinopril 10mg  and tolerated well in the past. She suspect poor sleep related to family stress and loss of 2 loved ones contributory. Notes possible salt indiscretion.  She is taking NSAIDs. She takes  General antihistamine however not sure if included decongestant.  She has been talking with family as it relates to grief and had a course at church. Some days harder than others. No si/hi.  Currently being evaluated left eye possible retina etiology and bilateral cataract.    HISTORY:  Past Medical History:  Diagnosis Date  . Allergy   . Colon polyp   . Depression   . Herpes   . Hx: UTI (urinary tract infection)   . Hyperlipidemia   . Hypertension    Past Surgical History:  Procedure Laterality Date  . ABDOMINAL HYSTERECTOMY    . [place]   lower back  . LAPAROSCOPIC BILATERAL SALPINGO OOPHERECTOMY Bilateral 01/22/2020   Procedure: LAPAROSCOPIC BILATERAL SALPINGO OOPHORECTOMY;  Surgeon: Mellody Drown, MD;  Location: [facility] ORS;  Service: Gynecology;  Laterality: Bilateral;  . OOPHORECTOMY    . TONSILLECTOMY  1963   Family History  Problem Relation Age of Onset  . Arthritis Mother   . Stroke Mother   . Hypertension Mother   . Alzheimer's disease Mother   . Dementia Mother   . Colon cancer Father   . Prostate cancer Father   . Mental illness Sister   . Alcohol abuse Brother   . Congestive Heart Failure Brother   . Colon cancer Paternal Uncle   . Colon cancer Maternal Grandmother   . Mental illness Maternal Grandmother   . Alzheimer's disease Maternal Grandmother   . Hyperlipidemia Maternal Grandfather   . Hypertension Maternal Grandfather    . Diabetes Maternal Grandfather   . Arthritis Paternal Grandmother   . Alcohol abuse Paternal Grandfather   . Breast cancer Neg Hx     Allergies: Penicillins Current Outpatient Medications on File Prior to Visit  Medication Sig Dispense Refill  . acetaminophen (TYLENOL) 325 MG tablet Take 650 mg by mouth as needed.    . loratadine (CLARITIN) 10 MG tablet Take 10 mg by mouth daily.    . Melatonin 10 MG TABS Take 20 mg by mouth at bedtime as needed (sleep).     . Multiple Vitamin (MULTIVITAMIN WITH MINERALS) TABS tablet Take 1 tablet by mouth daily. Women's Multivitamin     No current facility-administered medications on file prior to visit.    Social History   Tobacco Use  . Smoking status: Never Smoker  . Smokeless tobacco: Never Used  Vaping Use  . Vaping Use: Never used  Substance Use Topics  . Alcohol use: Yes    Alcohol/week: 1.0 - 2.0 standard drink    Types: 1 - 2 Standard drinks or equivalent per week  . Drug use: No    Review of Systems  Constitutional: Negative for chills and fever.  Eyes: Positive for visual disturbance (cataracts).  Respiratory: Negative for cough.   Cardiovascular: Negative for chest pain and palpitations.  Gastrointestinal: Negative for nausea and vomiting.  Neurological: Negative for  dizziness and headaches.  Psychiatric/Behavioral: Positive for sleep disturbance. Negative for suicidal ideas. The patient is not nervous/anxious.       Objective:    BP (!) 152/96   Pulse 82   Temp 97.6 F (36.4 C)   Ht 5\' 3"  (1.6 m)   Wt 154 lb 12.8 oz (70.2 kg)   SpO2 99%   BMI 27.42 kg/m  BP Readings from Last 3 Encounters:  10/09/20 (!) 152/96  02/26/20 (!) 154/93  01/22/20 133/68    Physical Exam Vitals reviewed.  Constitutional:      Appearance: She is well-developed and well-nourished.  Eyes:     Conjunctiva/sclera: Conjunctivae normal.  Cardiovascular:     Rate and Rhythm: Normal rate and regular rhythm.     Pulses: Normal  pulses.     Heart sounds: Normal heart sounds.  Pulmonary:     Effort: Pulmonary effort is normal.     Breath sounds: Normal breath sounds. No wheezing, rhonchi or rales.  Skin:    General: Skin is warm and dry.  Neurological:     Mental Status: She is alert.  Psychiatric:        Mood and Affect: Mood and affect normal.        Speech: Speech normal.        Behavior: Behavior normal.        Thought Content: Thought content normal.        Assessment & Plan:   Problem List Items Addressed This Visit      Cardiovascular and Mediastinum   Essential hypertension, benign - Primary    Uncontrolled. Suspect interrupted sleep, grief are also contributing. We decided to start back on lisinopril 10mg  for now and if in the future, she didn't require medication, we could discontinue. Labs today and then again in one week. Patient will contact me with blood pressure readings in one weeks time.  Follow up with PCP.       Relevant Medications   lisinopril (ZESTRIL) 10 MG tablet   Other Relevant Orders   Basic metabolic panel   Basic metabolic panel     Other   Grief reaction    Loss of two loved ones. Trial of trazodone and advised to take nightly for better effectiveness. Referral to grief counselor. Close follow up with PCP.       Relevant Medications   traZODone (DESYREL) 50 MG tablet   Other Relevant Orders   Ambulatory referral to Psychology        I have discontinued [name]'s docusate sodium, gabapentin, and oxyCODONE. I am also having her start on lisinopril and traZODone. Additionally, I am having her maintain her Melatonin, loratadine, multivitamin with minerals, and acetaminophen.   Meds ordered this encounter  Medications  . lisinopril (ZESTRIL) 10 MG tablet    Sig: Take 1 tablet (10 mg total) by mouth daily.    Dispense:  90 tablet    Refill:  3    Order Specific Question:   Supervising Provider    Answer:   Deborra Medina L [[id number]]  . traZODone (DESYREL)  50 MG tablet    Sig: Take 0.5-1 tablets (25-50 mg total) by mouth at bedtime as needed for sleep.    Dispense:  30 tablet    Refill:  3    Order Specific Question:   Supervising Provider    Answer:   Crecencio Mc [[id number]]    Return precautions given.   Risks, benefits, and alternatives of the  medications and treatment plan prescribed today were discussed, and patient expressed understanding.   Education regarding symptom management and diagnosis given to patient on AVS.  Continue to follow with Einar Pheasant, MD for routine health maintenance.   Huntley Dec and I agreed with plan.   Mable Paris, FNP

## 2020-10-09 NOTE — Assessment & Plan Note (Signed)
Loss of two loved ones. Trial of trazodone and advised to take nightly for better effectiveness. Referral to grief counselor. Close follow up with PCP.

## 2020-10-09 NOTE — Patient Instructions (Signed)
Start lisinopril 10mg  After one week, if blood pressure is not approaching 120/80 , please let me know  Labs today and in one weeks time  Start trazodone; I would start with 25mg  to 50mg  nightly and take for several weeks to see if you notice improvement of sleep.   Very nice to see you

## 2020-10-09 NOTE — Assessment & Plan Note (Addendum)
Uncontrolled. Suspect interrupted sleep, grief are also contributing. We decided to start back on lisinopril 10mg  for now and if in the future, she didn't require medication, we could discontinue. Labs today and then again in one week. Patient will contact me with blood pressure readings in one weeks time.  Follow up with PCP.

## 2020-10-19 ENCOUNTER — Other Ambulatory Visit (INDEPENDENT_AMBULATORY_CARE_PROVIDER_SITE_OTHER): Payer: Medicare Other

## 2020-10-19 ENCOUNTER — Other Ambulatory Visit: Payer: Self-pay

## 2020-10-19 DIAGNOSIS — I10 Essential (primary) hypertension: Secondary | ICD-10-CM | POA: Diagnosis not present

## 2020-10-19 DIAGNOSIS — Z1159 Encounter for screening for other viral diseases: Secondary | ICD-10-CM

## 2020-10-19 LAB — BASIC METABOLIC PANEL
BUN: 15 mg/dL (ref 6–23)
CO2: 29 mEq/L (ref 19–32)
Calcium: 9.2 mg/dL (ref 8.4–10.5)
Chloride: 103 mEq/L (ref 96–112)
Creatinine, Ser: 0.71 mg/dL (ref 0.40–1.20)
GFR: 88.31 mL/min (ref >60.00)
Glucose, Bld: 84 mg/dL (ref 70–99)
Potassium: 4.1 mEq/L (ref 3.5–5.1)
Sodium: 139 mEq/L (ref 135–145)

## 2020-10-20 LAB — HEPATITIS C ANTIBODY
Hepatitis C Ab: NONREACTIVE
SIGNAL TO CUT-OFF: 0.02 (ref <1.00)

## 2020-10-22 ENCOUNTER — Other Ambulatory Visit: Payer: Self-pay | Admitting: Family

## 2020-10-22 ENCOUNTER — Encounter: Payer: Self-pay | Admitting: Family

## 2020-10-22 DIAGNOSIS — I10 Essential (primary) hypertension: Secondary | ICD-10-CM

## 2020-10-22 MED ORDER — LISINOPRIL 20 MG PO TABS: 20.0000 mg | ORAL_TABLET | Freq: Every day | ORAL | 1 refills | Status: DC

## 2020-10-22 NOTE — Telephone Encounter (Signed)
LM once again to call back. I did let patient know on message that 20mg  of lisinopril was sent & that she could take two of the 10mg  to use up. I asked that she please call back for repeat labs on one week.

## 2020-10-22 NOTE — Telephone Encounter (Signed)
LMTCB Please call back to let patient know that she needs to increase Lisinopril.

## 2020-10-23 NOTE — Telephone Encounter (Signed)
Please call pt.  She saw Joycelyn Schmid and was started back on lisinopril.  Joycelyn Schmid has advised to increase lisinopril to 20mg  q day.  Make sure she is aware.  Also, if having increased stress, can schedule appt to discuss treating her stress.  She has an appt with me in 11/2020.   If feels needs earlier appt - can schedule.

## 2020-11-06 ENCOUNTER — Other Ambulatory Visit: Payer: Self-pay

## 2020-11-06 ENCOUNTER — Encounter: Payer: Self-pay | Admitting: Internal Medicine

## 2020-11-06 ENCOUNTER — Ambulatory Visit (INDEPENDENT_AMBULATORY_CARE_PROVIDER_SITE_OTHER): Payer: Medicare Other | Admitting: Internal Medicine

## 2020-11-06 DIAGNOSIS — Z86010 Personal history of colonic polyps: Secondary | ICD-10-CM

## 2020-11-06 DIAGNOSIS — H3581 Retinal edema: Secondary | ICD-10-CM

## 2020-11-06 DIAGNOSIS — I10 Essential (primary) hypertension: Secondary | ICD-10-CM | POA: Diagnosis not present

## 2020-11-06 DIAGNOSIS — N9489 Other specified conditions associated with female genital organs and menstrual cycle: Secondary | ICD-10-CM

## 2020-11-06 DIAGNOSIS — E7800 Pure hypercholesterolemia, unspecified: Secondary | ICD-10-CM

## 2020-11-06 DIAGNOSIS — F439 Reaction to severe stress, unspecified: Secondary | ICD-10-CM

## 2020-11-06 MED ORDER — AMLODIPINE BESYLATE 2.5 MG PO TABS: 2.5000 mg | ORAL_TABLET | Freq: Every day | ORAL | 2 refills | Status: DC

## 2020-11-06 NOTE — Progress Notes (Signed)
Patient ID: Brooke Gallagher, female   DOB: 09/11/1955, 64 y.o.   MRN: [id number]   Subjective:    Patient ID: Brooke Gallagher, female    DOB: February 08, 1956, 65 y.o.   MRN: [id number]  HPI This visit occurred during the SARS-CoV-2 public health emergency.  Safety protocols were in place, including screening questions prior to the visit, additional usage of staff PPE, and extensive cleaning of exam room while observing appropriate contact time as indicated for disinfecting solutions.  Patient here for a scheduled follow up. Here to follow up regarding her blood pressure and increased stress.  Her brother and father - passed - within one year.  She is now living with her brother.  Handling stress.  Does not feel she needs anything more at this time.  No chest pain or sob.  No acid reflux or abdominal pain reported.  Bowels moving.  Planning eye surgery next week.  Blood pressure has been elevated.  Lisinopril recently increased to 20mg  q day.     Past Medical History:  Diagnosis Date  . Allergy   . Colon polyp   . Depression   . Herpes   . Hx: UTI (urinary tract infection)   . Hyperlipidemia   . Hypertension    Past Surgical History:  Procedure Laterality Date  . ABDOMINAL HYSTERECTOMY    . [place]   lower back  . LAPAROSCOPIC BILATERAL SALPINGO OOPHERECTOMY Bilateral 01/22/2020   Procedure: LAPAROSCOPIC BILATERAL SALPINGO OOPHORECTOMY;  Surgeon: Mellody Drown, MD;  Location: [facility] ORS;  Service: Gynecology;  Laterality: Bilateral;  . OOPHORECTOMY    . TONSILLECTOMY  1963   Family History  Problem Relation Age of Onset  . Arthritis Mother   . Stroke Mother   . Hypertension Mother   . Alzheimer's disease Mother   . Dementia Mother   . Colon cancer Father   . Prostate cancer Father   . Mental illness Sister   . Alcohol abuse Brother   . Congestive Heart Failure Brother   . Colon cancer Paternal Uncle   . Colon cancer Maternal Grandmother   . Mental illness Maternal  Grandmother   . Alzheimer's disease Maternal Grandmother   . Hyperlipidemia Maternal Grandfather   . Hypertension Maternal Grandfather   . Diabetes Maternal Grandfather   . Arthritis Paternal Grandmother   . Alcohol abuse Paternal Grandfather   . Breast cancer Neg Hx    Social History   Socioeconomic History  . Marital status: Single    Spouse name: Not on file  . Number of children: Not on file  . Years of education: Not on file  . Highest education level: Not on file  Occupational History  . Not on file  Tobacco Use  . Smoking status: Never Smoker  . Smokeless tobacco: Never Used  Vaping Use  . Vaping Use: Never used  Substance and Sexual Activity  . Alcohol use: Yes    Alcohol/week: 1.0 - 2.0 standard drink    Types: 1 - 2 Standard drinks or equivalent per week  . Drug use: No  . Sexual activity: Not Currently  Other Topics Concern  . Not on file  Social History Narrative  . Not on file   Social Determinants of Health   Financial Resource Strain: Not on file  Food Insecurity: Not on file  Transportation Needs: Not on file  Physical Activity: Not on file  Stress: Not on file  Social Connections: Not on file    Outpatient  Encounter Medications as of 11/06/2020  Medication Sig  . amLODipine (NORVASC) 2.5 MG tablet Take 1 tablet (2.5 mg total) by mouth daily.  [name] Kitchen acetaminophen (TYLENOL) 325 MG tablet Take 650 mg by mouth as needed.  [name] Kitchen lisinopril (ZESTRIL) 20 MG tablet Take 1 tablet (20 mg total) by mouth daily.  [name] Kitchen loratadine (CLARITIN) 10 MG tablet Take 10 mg by mouth daily.  . Melatonin 10 MG TABS Take 20 mg by mouth at bedtime as needed (sleep).   . Multiple Vitamin (MULTIVITAMIN WITH MINERALS) TABS tablet Take 1 tablet by mouth daily. Women's Multivitamin  . traZODone (DESYREL) 50 MG tablet Take 0.5-1 tablets (25-50 mg total) by mouth at bedtime as needed for sleep.   No facility-administered encounter medications on file as of 11/06/2020.    Review of Systems   Constitutional: Negative for appetite change and unexpected weight change.  HENT: Negative for congestion and sinus pressure.   Respiratory: Negative for cough, chest tightness and shortness of breath.   Cardiovascular: Negative for chest pain, palpitations and leg swelling.  Gastrointestinal: Negative for abdominal pain, diarrhea, nausea and vomiting.  Genitourinary: Negative for difficulty urinating and dysuria.  Musculoskeletal: Negative for joint swelling and myalgias.  Skin: Negative for color change and rash.  Neurological: Negative for dizziness and light-headedness.       Intermittent headache - she relates to her eye.  Planning surgery as outlined.   Psychiatric/Behavioral: Negative for agitation and dysphoric mood.       Increased stress as outlined.         Objective:    Physical Exam Vitals reviewed.  Constitutional:      General: She is not in acute distress.    Appearance: Normal appearance.  HENT:     Head: Normocephalic and atraumatic.     Right Ear: External ear normal.     Left Ear: External ear normal.     Nose: Nose normal.  Neck:     Thyroid: No thyromegaly.  Cardiovascular:     Rate and Rhythm: Normal rate and regular rhythm.  Pulmonary:     Effort: No respiratory distress.     Breath sounds: Normal breath sounds. No wheezing.  Abdominal:     General: Bowel sounds are normal.     Palpations: Abdomen is soft.     Tenderness: There is no abdominal tenderness.  Musculoskeletal:        General: No swelling or tenderness.     Cervical back: Neck supple.  Lymphadenopathy:     Cervical: No cervical adenopathy.  Skin:    Findings: No erythema or rash.  Neurological:     Mental Status: She is alert.  Psychiatric:        Mood and Affect: Mood normal.        Behavior: Behavior normal.     BP 130/84   Pulse 68   Temp (!) 97.5 F (36.4 C) (Oral)   Resp 16   Ht 5\' 3"  (1.6 m)   Wt 154 lb (69.9 kg)   SpO2 99%   BMI 27.28 kg/m  Wt Readings from  Last 3 Encounters:  11/06/20 154 lb (69.9 kg)  10/09/20 154 lb 12.8 oz (70.2 kg)  02/26/20 149 lb 8 oz (67.8 kg)     Lab Results  Component Value Date   WBC 6.5 01/15/2020   HGB 13.9 01/15/2020   HCT 41.2 01/15/2020   PLT 276 01/15/2020   GLUCOSE 84 10/19/2020   CHOL 185 12/18/2019   TRIG  101.0 12/18/2019   HDL 37.10 (L) 12/18/2019   LDLDIRECT 146.7 05/03/2013   LDLCALC 128 (H) 12/18/2019   ALT 19 01/15/2020   AST 20 01/15/2020   NA 139 10/19/2020   K 4.1 10/19/2020   CL 103 10/19/2020   CREATININE 0.71 10/19/2020   BUN 15 10/19/2020   CO2 29 10/19/2020   TSH 3.18 06/17/2019    MM 3D SCREEN BREAST BILATERAL  Result Date: 03/30/2020 CLINICAL DATA:  Screening. EXAM: DIGITAL SCREENING BILATERAL MAMMOGRAM WITH TOMO AND CAD COMPARISON:  Previous exam(s). ACR Breast Density Category b: There are scattered areas of fibroglandular density. FINDINGS: There are no findings suspicious for malignancy. Images were processed with CAD. IMPRESSION: No mammographic evidence of malignancy. A result letter of this screening mammogram will be mailed directly to the patient. RECOMMENDATION: Screening mammogram in one year. (Code:SM-B-01Y) BI-RADS CATEGORY  1: Negative. Electronically Signed   By: Ammie Ferrier M.D.   On: 03/30/2020 14:33       Assessment & Plan:   Problem List Items Addressed This Visit    Adnexal mass    S/p removal of benign serous cystadenoma.  Released by gyn.  Doing well.       Essential hypertension, benign    Blood pressure remaining elevated.  Continue lisinopril 20mg  q day.  Just increased recently.  Add amlodipine 2.5mg  q day.  Follow pressures.  Get her back in soon to reassess.        Relevant Medications   amLODipine (NORVASC) 2.5 MG tablet   History of colonic polyps    Colonoscopy 10/2016 - diverticulosis and internal hemorrhoids.  Recommended f/u colonoscopy in 5 years.        Hypercholesterolemia    Low cholesterol diet and exercise.  Follow lipid  panel.       Relevant Medications   amLODipine (NORVASC) 2.5 MG tablet   Retinal edema    Planning for eye surgery.  Just had recent gyn surgery and did well.  No chest pain or sob.  Currently without symptoms.  Will need close intra op and post op monitoring of heart rate and blood pressure to avoid extremes.        Stress    Increased stress as outlined.  Discussed.  Brother living with her.  She does not feel needs any further intervention.  Follow.            Einar Pheasant, MD

## 2020-11-08 ENCOUNTER — Encounter: Payer: Self-pay | Admitting: Internal Medicine

## 2020-11-08 DIAGNOSIS — H3581 Retinal edema: Secondary | ICD-10-CM | POA: Insufficient documentation

## 2020-11-08 NOTE — Assessment & Plan Note (Signed)
S/p removal of benign serous cystadenoma.  Released by gyn.  Doing well.

## 2020-11-08 NOTE — Assessment & Plan Note (Signed)
Planning for eye surgery.  Just had recent gyn surgery and did well.  No chest pain or sob.  Currently without symptoms.  Will need close intra op and post op monitoring of heart rate and blood pressure to avoid extremes.

## 2020-11-08 NOTE — Assessment & Plan Note (Signed)
Increased stress as outlined.  Discussed.  Brother living with her.  She does not feel needs any further intervention.  Follow.

## 2020-11-08 NOTE — Assessment & Plan Note (Signed)
Colonoscopy 10/2016 - diverticulosis and internal hemorrhoids.  Recommended f/u colonoscopy in 5 years.

## 2020-11-08 NOTE — Assessment & Plan Note (Signed)
Low cholesterol diet and exercise.  Follow lipid panel.   

## 2020-11-08 NOTE — Assessment & Plan Note (Signed)
Blood pressure remaining elevated.  Continue lisinopril 20mg  q day.  Just increased recently.  Add amlodipine 2.5mg  q day.  Follow pressures.  Get her back in soon to reassess.

## 2020-11-24 ENCOUNTER — Ambulatory Visit: Payer: Medicare Other | Admitting: Internal Medicine

## 2020-12-11 ENCOUNTER — Other Ambulatory Visit: Payer: Self-pay

## 2020-12-11 ENCOUNTER — Ambulatory Visit (INDEPENDENT_AMBULATORY_CARE_PROVIDER_SITE_OTHER): Payer: Medicare Other | Admitting: Internal Medicine

## 2020-12-11 DIAGNOSIS — E7800 Pure hypercholesterolemia, unspecified: Secondary | ICD-10-CM

## 2020-12-11 DIAGNOSIS — F439 Reaction to severe stress, unspecified: Secondary | ICD-10-CM

## 2020-12-11 DIAGNOSIS — I10 Essential (primary) hypertension: Secondary | ICD-10-CM | POA: Diagnosis not present

## 2020-12-11 DIAGNOSIS — Z86010 Personal history of colonic polyps: Secondary | ICD-10-CM

## 2020-12-11 NOTE — Progress Notes (Signed)
Patient ID: Brooke Gallagher, female   DOB: [birth year], 65 y.o.   MRN: [id number]   Subjective:    Patient ID: Brooke Gallagher, female    DOB: [birth year], 65 y.o.   MRN: [id number]  HPI This visit occurred during the SARS-CoV-2 public health emergency.  Safety protocols were in place, including screening questions prior to the visit, additional usage of staff PPE, and extensive cleaning of exam room while observing appropriate contact time as indicated for disinfecting solutions.  Patient here for a scheduled follow up.  Here to follow up regarding her blood pressure.  Last visit, she was started on amlodipine.  Continued on lisinopril.  She is tolerating the medication.  Reviewed outside blood pressures - 116-130/70s since adding amlodipine.  Is walking.  Works in her yard.  Coping with increased stress.  Does not feel needs any further intervention.  Eating.  No nausea or vomiting reported.  No abdominal pain.  Bowels moving.    Past Medical History:  Diagnosis Date  . Allergy   . Colon polyp   . Depression   . Herpes   . Hx: UTI (urinary tract infection)   . Hyperlipidemia   . Hypertension    Past Surgical History:  Procedure Laterality Date  . ABDOMINAL HYSTERECTOMY    . [place]   lower back  . LAPAROSCOPIC BILATERAL SALPINGO OOPHERECTOMY Bilateral 01/22/2020   Procedure: LAPAROSCOPIC BILATERAL SALPINGO OOPHORECTOMY;  Surgeon: Mellody Drown, MD;  Location: [facility] ORS;  Service: Gynecology;  Laterality: Bilateral;  . OOPHORECTOMY    . TONSILLECTOMY  1963   Family History  Problem Relation Age of Onset  . Arthritis Mother   . Stroke Mother   . Hypertension Mother   . Alzheimer's disease Mother   . Dementia Mother   . Colon cancer Father   . Prostate cancer Father   . Mental illness Sister   . Alcohol abuse Brother   . Congestive Heart Failure Brother   . Colon cancer Paternal Uncle   . Colon cancer Maternal Grandmother   . Mental illness Maternal Grandmother    . Alzheimer's disease Maternal Grandmother   . Hyperlipidemia Maternal Grandfather   . Hypertension Maternal Grandfather   . Diabetes Maternal Grandfather   . Arthritis Paternal Grandmother   . Alcohol abuse Paternal Grandfather   . Breast cancer Neg Hx    Social History   Socioeconomic History  . Marital status: Single    Spouse name: Not on file  . Number of children: Not on file  . Years of education: Not on file  . Highest education level: Not on file  Occupational History  . Not on file  Tobacco Use  . Smoking status: Never Smoker  . Smokeless tobacco: Never Used  Vaping Use  . Vaping Use: Never used  Substance and Sexual Activity  . Alcohol use: Yes    Alcohol/week: 1.0 - 2.0 standard drink    Types: 1 - 2 Standard drinks or equivalent per week  . Drug use: No  . Sexual activity: Not Currently  Other Topics Concern  . Not on file  Social History Narrative  . Not on file   Social Determinants of Health   Financial Resource Strain: Not on file  Food Insecurity: Not on file  Transportation Needs: Not on file  Physical Activity: Not on file  Stress: Not on file  Social Connections: Not on file    Outpatient Encounter Medications as of 12/11/2020  Medication Sig  .  acetaminophen (TYLENOL) 325 MG tablet Take 650 mg by mouth as needed.  [name] Kitchen amLODipine (NORVASC) 2.5 MG tablet Take 1 tablet (2.5 mg total) by mouth daily.  [name] Kitchen lisinopril (ZESTRIL) 20 MG tablet Take 1 tablet (20 mg total) by mouth daily.  [name] Kitchen loratadine (CLARITIN) 10 MG tablet Take 10 mg by mouth daily.  . Melatonin 10 MG TABS Take 20 mg by mouth at bedtime as needed (sleep).   . Multiple Vitamin (MULTIVITAMIN WITH MINERALS) TABS tablet Take 1 tablet by mouth daily. Women's Multivitamin  . traZODone (DESYREL) 50 MG tablet Take 0.5-1 tablets (25-50 mg total) by mouth at bedtime as needed for sleep.   No facility-administered encounter medications on file as of 12/11/2020.    Review of Systems   Constitutional: Negative for appetite change and unexpected weight change.  HENT: Negative for congestion and sinus pressure.   Respiratory: Negative for cough, chest tightness and shortness of breath.   Cardiovascular: Negative for chest pain, palpitations and leg swelling.  Gastrointestinal: Negative for abdominal pain, diarrhea, nausea and vomiting.  Genitourinary: Negative for difficulty urinating and dysuria.  Musculoskeletal: Negative for joint swelling and myalgias.  Skin: Negative for color change and rash.  Neurological: Negative for dizziness, light-headedness and headaches.  Psychiatric/Behavioral: Negative for agitation and dysphoric mood.       Objective:    Physical Exam Vitals reviewed.  Constitutional:      General: She is not in acute distress.    Appearance: Normal appearance.  HENT:     Head: Normocephalic and atraumatic.     Right Ear: External ear normal.     Left Ear: External ear normal.  Eyes:     General: No scleral icterus.       Right eye: No discharge.        Left eye: No discharge.     Conjunctiva/sclera: Conjunctivae normal.  Neck:     Thyroid: No thyromegaly.  Cardiovascular:     Rate and Rhythm: Normal rate and regular rhythm.  Pulmonary:     Effort: No respiratory distress.     Breath sounds: Normal breath sounds. No wheezing.  Abdominal:     General: Bowel sounds are normal.     Palpations: Abdomen is soft.     Tenderness: There is no abdominal tenderness.  Musculoskeletal:        General: No swelling or tenderness.     Cervical back: Neck supple. No tenderness.  Lymphadenopathy:     Cervical: No cervical adenopathy.  Skin:    Findings: No erythema or rash.  Neurological:     Mental Status: She is alert.  Psychiatric:        Mood and Affect: Mood normal.        Behavior: Behavior normal.     BP 128/74   Pulse 78   Temp (!) 96.7 F (35.9 C) (Oral)   Resp 16   Ht 5\' 3"  (1.6 m)   Wt 156 lb 9.6 oz (71 kg)   SpO2 99%   BMI  27.74 kg/m  Wt Readings from Last 3 Encounters:  12/11/20 156 lb 9.6 oz (71 kg)  11/06/20 154 lb (69.9 kg)  10/09/20 154 lb 12.8 oz (70.2 kg)     Lab Results  Component Value Date   WBC 6.5 01/15/2020   HGB 13.9 01/15/2020   HCT 41.2 01/15/2020   PLT 276 01/15/2020   GLUCOSE 84 10/19/2020   CHOL 185 12/18/2019   TRIG 101.0 12/18/2019   HDL 37.10 (  L) 12/18/2019   LDLDIRECT 146.7 05/03/2013   LDLCALC 128 (H) 12/18/2019   ALT 19 01/15/2020   AST 20 01/15/2020   NA 139 10/19/2020   K 4.1 10/19/2020   CL 103 10/19/2020   CREATININE 0.71 10/19/2020   BUN 15 10/19/2020   CO2 29 10/19/2020   TSH 3.18 06/17/2019    MM 3D SCREEN BREAST BILATERAL  Result Date: 03/30/2020 CLINICAL DATA:  Screening. EXAM: DIGITAL SCREENING BILATERAL MAMMOGRAM WITH TOMO AND CAD COMPARISON:  Previous exam(s). ACR Breast Density Category b: There are scattered areas of fibroglandular density. FINDINGS: There are no findings suspicious for malignancy. Images were processed with CAD. IMPRESSION: No mammographic evidence of malignancy. A result letter of this screening mammogram will be mailed directly to the patient. RECOMMENDATION: Screening mammogram in one year. (Code:SM-B-01Y) BI-RADS CATEGORY  1: Negative. Electronically Signed   By: Ammie Ferrier M.D.   On: 03/30/2020 14:33       Assessment & Plan:   Problem List Items Addressed This Visit    Essential hypertension, benign    Currently on lisinopril and amlodipine.  Blood pressure doing better as outlined.  Continue current medication regimen.  Follow pressures.  Follow metabolic panel.       Relevant Orders   CBC with Differential/Platelet   Basic metabolic panel   TSH   History of colonic polyps    Colonoscopy 10/2016 - diverticulosis and internal hemorrhoids.  Follow up colonoscopy 5 years after last.       Hypercholesterolemia    Low cholesterol diet and exercise. Follow lipid panel.       Relevant Orders   Hepatic function panel    Lipid panel   Stress    Discussed with her today.  Overall she feels she is handling things relatively well.  Living with her brother.  Good support.  Follow.           Einar Pheasant, MD

## 2020-12-13 ENCOUNTER — Encounter: Payer: Self-pay | Admitting: Internal Medicine

## 2020-12-13 NOTE — Assessment & Plan Note (Signed)
Colonoscopy 10/2016 - diverticulosis and internal hemorrhoids.  Follow up colonoscopy 5 years after last.

## 2020-12-13 NOTE — Assessment & Plan Note (Signed)
Discussed with her today.  Overall she feels she is handling things relatively well.  Living with her brother.  Good support.  Follow.

## 2020-12-13 NOTE — Assessment & Plan Note (Signed)
Low cholesterol diet and exercise.  Follow lipid panel.   

## 2020-12-13 NOTE — Assessment & Plan Note (Signed)
Currently on lisinopril and amlodipine.  Blood pressure doing better as outlined.  Continue current medication regimen.  Follow pressures.  Follow metabolic panel.

## 2020-12-29 ENCOUNTER — Ambulatory Visit (INDEPENDENT_AMBULATORY_CARE_PROVIDER_SITE_OTHER): Payer: Medicare Other

## 2020-12-29 VITALS — Ht 63.0 in | Wt 156.0 lb

## 2020-12-29 DIAGNOSIS — Z780 Asymptomatic menopausal state: Secondary | ICD-10-CM | POA: Diagnosis not present

## 2020-12-29 DIAGNOSIS — Z0000 Encounter for general adult medical examination without abnormal findings: Secondary | ICD-10-CM

## 2020-12-29 NOTE — Patient Instructions (Addendum)
Ms. Cutsforth , Thank you for taking time to come for your Medicare Wellness Visit. I appreciate your ongoing commitment to your health goals. Please review the following plan we discussed and let me know if I can assist you in the future.   These are the goals we discussed: Goals      Patient Stated   .  I would like to lower my weight (pt-stated)       This is a list of the screening recommended for you and due dates:  Health Maintenance  Topic Date Due  . DEXA scan (bone density measurement)  Never done  . Pneumonia vaccines (2 of 2 - PPSV23) 11/06/2021*  . Mammogram  03/30/2022  . Colon Cancer Screening  10/14/2026  . Tetanus Vaccine  03/05/2030  . COVID-19 Vaccine  Completed  . Hepatitis C Screening: USPSTF Recommendation to screen - Ages 30-79 yo.  Completed  . HPV Vaccine  Aged Out  . Flu Shot  Discontinued  *Topic was postponed. The date shown is not the original due date.    Immunizations Immunization History  Administered Date(s) Administered  . Fluad Quad(high Dose 65+) 05/21/2019  . Influenza-Unspecified 06/01/2020  . PFIZER(Purple Top)SARS-COV-2 Vaccination 09/27/2019, 10/23/2019, 06/01/2020  . Pneumococcal Conjugate-13 06/13/2019  . Tdap 03/05/2020   Conditions/risks identified: none new  Follow up in one year for your annual wellness visit    Preventive Care 33 Years and Older, Female Preventive care refers to lifestyle choices and visits with your health care provider that can promote health and wellness. What does preventive care include?  A yearly physical exam. This is also called an annual well check.  Dental exams once or twice a year.  Routine eye exams. Ask your health care provider how often you should have your eyes checked.  Personal lifestyle choices, including:  Daily care of your teeth and gums.  Regular physical activity.  Eating a healthy diet.  Avoiding tobacco and drug use.  Limiting alcohol use.  Practicing safe  sex.  Taking low-dose aspirin every day.  Taking vitamin and mineral supplements as recommended by your health care provider. What happens during an annual well check? The services and screenings done by your health care provider during your annual well check will depend on your age, overall health, lifestyle risk factors, and family history of disease. Counseling  Your health care provider may ask you questions about your:  Alcohol use.  Tobacco use.  Drug use.  Emotional well-being.  Home and relationship well-being.  Sexual activity.  Eating habits.  History of falls.  Memory and ability to understand (cognition).  Work and work Statistician.  Reproductive health. Screening  You may have the following tests or measurements:  Height, weight, and BMI.  Blood pressure.  Lipid and cholesterol levels. These may be checked every 5 years, or more frequently if you are over 74 years old.  Skin check.  Lung cancer screening. You may have this screening every year starting at age 40 if you have a 30-pack-year history of smoking and currently smoke or have quit within the past 15 years.  Fecal occult blood test (FOBT) of the stool. You may have this test every year starting at age 46.  Flexible sigmoidoscopy or colonoscopy. You may have a sigmoidoscopy every 5 years or a colonoscopy every 10 years starting at age 77.  Hepatitis C blood test.  Hepatitis B blood test.  Sexually transmitted disease (STD) testing.  Diabetes screening. This is done by checking  your blood sugar (glucose) after you have not eaten for a while (fasting). You may have this done every 1-3 years.  Bone density scan. This is done to screen for osteoporosis. You may have this done starting at age 89.  Mammogram. This may be done every 1-2 years. Talk to your health care provider about how often you should have regular mammograms. Talk with your health care provider about your test results,  treatment options, and if necessary, the need for more tests. Vaccines  Your health care provider may recommend certain vaccines, such as:  Influenza vaccine. This is recommended every year.  Tetanus, diphtheria, and acellular pertussis (Tdap, Td) vaccine. You may need a Td booster every 10 years.  Zoster vaccine. You may need this after age 72.  Pneumococcal 13-valent conjugate (PCV13) vaccine. One dose is recommended after age 55.  Pneumococcal polysaccharide (PPSV23) vaccine. One dose is recommended after age 20. Talk to your health care provider about which screenings and vaccines you need and how often you need them. This information is not intended to replace advice given to you by your health care provider. Make sure you discuss any questions you have with your health care provider. Document Released: 08/28/2015 Document Revised: 04/20/2016 Document Reviewed: 06/02/2015 Elsevier Interactive Patient Education  2017 [employer] Prevention in the Home Falls can cause injuries. They can happen to people of all ages. There are many things you can do to make your home safe and to help prevent falls. What can I do on the outside of my home?  Regularly fix the edges of walkways and driveways and fix any cracks.  Remove anything that might make you trip as you walk through a door, such as a raised step or threshold.  Trim any bushes or trees on the path to your home.  Use bright outdoor lighting.  Clear any walking paths of anything that might make someone trip, such as rocks or tools.  Regularly check to see if handrails are loose or broken. Make sure that both sides of any steps have handrails.  Any raised decks and porches should have guardrails on the edges.  Have any leaves, snow, or ice cleared regularly.  Use sand or salt on walking paths during winter.  Clean up any spills in your garage right away. This includes oil or grease spills. What can I do in the  bathroom?  Use night lights.  Install grab bars by the toilet and in the tub and shower. Do not use towel bars as grab bars.  Use non-skid mats or decals in the tub or shower.  If you need to sit down in the shower, use a plastic, non-slip stool.  Keep the floor dry. Clean up any water that spills on the floor as soon as it happens.  Remove soap buildup in the tub or shower regularly.  Attach bath mats securely with double-sided non-slip rug tape.  Do not have throw rugs and other things on the floor that can make you trip. What can I do in the bedroom?  Use night lights.  Make sure that you have a light by your bed that is easy to reach.  Do not use any sheets or blankets that are too big for your bed. They should not hang down onto the floor.  Have a firm chair that has side arms. You can use this for support while you get dressed.  Do not have throw rugs and other things on the floor  that can make you trip. What can I do in the kitchen?  Clean up any spills right away.  Avoid walking on wet floors.  Keep items that you use a lot in easy-to-reach places.  If you need to reach something above you, use a strong step stool that has a grab bar.  Keep electrical cords out of the way.  Do not use floor polish or wax that makes floors slippery. If you must use wax, use non-skid floor wax.  Do not have throw rugs and other things on the floor that can make you trip. What can I do with my stairs?  Do not leave any items on the stairs.  Make sure that there are handrails on both sides of the stairs and use them. Fix handrails that are broken or loose. Make sure that handrails are as long as the stairways.  Check any carpeting to make sure that it is firmly attached to the stairs. Fix any carpet that is loose or worn.  Avoid having throw rugs at the top or bottom of the stairs. If you do have throw rugs, attach them to the floor with carpet tape.  Make sure that you have a  light switch at the top of the stairs and the bottom of the stairs. If you do not have them, ask someone to add them for you. What else can I do to help prevent falls?  Wear shoes that:  Do not have high heels.  Have rubber bottoms.  Are comfortable and fit you well.  Are closed at the toe. Do not wear sandals.  If you use a stepladder:  Make sure that it is fully opened. Do not climb a closed stepladder.  Make sure that both sides of the stepladder are locked into place.  Ask someone to hold it for you, if possible.  Clearly mark and make sure that you can see:  Any grab bars or handrails.  First and last steps.  Where the edge of each step is.  Use tools that help you move around (mobility aids) if they are needed. These include:  Canes.  Walkers.  Scooters.  Crutches.  Turn on the lights when you go into a dark area. Replace any light bulbs as soon as they burn out.  Set up your furniture so you have a clear path. Avoid moving your furniture around.  If any of your floors are uneven, fix them.  If there are any pets around you, be aware of where they are.  Review your medicines with your doctor. Some medicines can make you feel dizzy. This can increase your chance of falling. Ask your doctor what other things that you can do to help prevent falls. This information is not intended to replace advice given to you by your health care provider. Make sure you discuss any questions you have with your health care provider. Document Released: 05/28/2009 Document Revised: 01/07/2016 Document Reviewed: 09/05/2014 Elsevier Interactive Patient Education  2017 [employer].

## 2020-12-29 NOTE — Progress Notes (Signed)
Subjective:   Brooke Gallagher is a 65 y.o. female who presents for Medicare Annual (Subsequent) preventive examination.  Review of Systems    No ROS.  Medicare Wellness Virtual Visit.  Visual/audio telehealth visit, UTA vital signs.   See social history for additional risk factors.   Cardiac Risk Factors include: advanced age (>70men, >22 women);hypertension     Objective:    Today's Vitals   12/29/20 0829  Weight: 156 lb (70.8 kg)  Height: 5\' 3"  (1.6 m)   Body mass index is 27.63 kg/m.  Advanced Directives 12/29/2020 02/26/2020 01/15/2020 12/27/2019 03/22/2017  Does Patient Have a Medical Advance Directive? Yes Yes Yes Yes No  Type of Paramedic of [place];Living will [place];Living will [place];Living will [place];Living will -  Does patient want to make changes to medical advance directive? No - Patient declined - - No - Patient declined -  Copy of [place] in Chart? Yes - validated most recent copy scanned in chart (See row information) - - Yes - validated most recent copy scanned in chart (See row information) -  Would patient like information on creating a medical advance directive? - - - - Yes (MAU/Ambulatory/Procedural Areas - Information given)    Current Medications (verified) Outpatient Encounter Medications as of 12/29/2020  Medication Sig  . acetaminophen (TYLENOL) 325 MG tablet Take 650 mg by mouth as needed.  [name] Kitchen amLODipine (NORVASC) 2.5 MG tablet Take 1 tablet (2.5 mg total) by mouth daily.  [name] Kitchen lisinopril (ZESTRIL) 20 MG tablet Take 1 tablet (20 mg total) by mouth daily.  [name] Kitchen loratadine (CLARITIN) 10 MG tablet Take 10 mg by mouth daily.  . Melatonin 10 MG TABS Take 20 mg by mouth at bedtime as needed (sleep).   . Multiple Vitamin (MULTIVITAMIN WITH MINERALS) TABS tablet Take 1 tablet by mouth daily. Women's Multivitamin  . traZODone (DESYREL) 50 MG tablet Take  0.5-1 tablets (25-50 mg total) by mouth at bedtime as needed for sleep.   No facility-administered encounter medications on file as of 12/29/2020.    Allergies (verified) Penicillins   History: Past Medical History:  Diagnosis Date  . Allergy   . Colon polyp   . Depression   . Herpes   . Hx: UTI (urinary tract infection)   . Hyperlipidemia   . Hypertension    Past Surgical History:  Procedure Laterality Date  . ABDOMINAL HYSTERECTOMY    . [place]   lower back  . LAPAROSCOPIC BILATERAL SALPINGO OOPHERECTOMY Bilateral 01/22/2020   Procedure: LAPAROSCOPIC BILATERAL SALPINGO OOPHORECTOMY;  Surgeon: Mellody Drown, MD;  Location: [facility] ORS;  Service: Gynecology;  Laterality: Bilateral;  . OOPHORECTOMY    . TONSILLECTOMY  1963   Family History  Problem Relation Age of Onset  . Arthritis Mother   . Stroke Mother   . Hypertension Mother   . Alzheimer's disease Mother   . Dementia Mother   . Colon cancer Father   . Prostate cancer Father   . Mental illness Sister   . Alcohol abuse Brother   . Congestive Heart Failure Brother   . Colon cancer Paternal Uncle   . Colon cancer Maternal Grandmother   . Mental illness Maternal Grandmother   . Alzheimer's disease Maternal Grandmother   . Hyperlipidemia Maternal Grandfather   . Hypertension Maternal Grandfather   . Diabetes Maternal Grandfather   . Arthritis Paternal Grandmother   . Alcohol abuse Paternal Grandfather   .  Breast cancer Neg Hx    Social History   Socioeconomic History  . Marital status: Single    Spouse name: Not on file  . Number of children: Not on file  . Years of education: Not on file  . Highest education level: Not on file  Occupational History  . Not on file  Tobacco Use  . Smoking status: Never Smoker  . Smokeless tobacco: Never Used  Vaping Use  . Vaping Use: Never used  Substance and Sexual Activity  . Alcohol use: Yes    Alcohol/week: 1.0 - 2.0 standard drink    Types: 1 - 2  Standard drinks or equivalent per week  . Drug use: No  . Sexual activity: Not Currently  Other Topics Concern  . Not on file  Social History Narrative  . Not on file   Social Determinants of Health   Financial Resource Strain: Low Risk   . Difficulty of Paying Living Expenses: Not hard at all  Food Insecurity: No Food Insecurity  . Worried About Programme researcher, broadcasting/film/video in the Last Year: Never true  . Ran Out of Food in the Last Year: Never true  Transportation Needs: No Transportation Needs  . Lack of Transportation (Medical): No  . Lack of Transportation (Non-Medical): No  Physical Activity: Sufficiently Active  . Days of Exercise per Week: 5 days  . Minutes of Exercise per Session: 30 min  Stress: Stress Concern Present  . Feeling of Stress : To some extent  Social Connections: Unknown  . Frequency of Communication with Friends and Family: More than three times a week  . Frequency of Social Gatherings with Friends and Family: More than three times a week  . Attends Religious Services: More than 4 times per year  . Active Member of Clubs or Organizations: Not on file  . Attends Banker Meetings: Not on file  . Marital Status: Not on file    Tobacco Counseling Counseling given: Not Answered   Clinical Intake:  Pre-visit preparation completed: Yes        Diabetes: No  How often do you need to have someone help you when you read instructions, pamphlets, or other written materials from your doctor or pharmacy?: 1 - Never    Interpreter Needed?: No      Activities of Daily Living In your present state of health, do you have any difficulty performing the following activities: 12/29/2020  Hearing? N  Vision? N  Difficulty concentrating or making decisions? N  Walking or climbing stairs? N  Dressing or bathing? N  Doing errands, shopping? N  Preparing Food and eating ? N  Using the Toilet? N  In the past six months, have you accidently leaked urine?  N  Do you have problems with loss of bowel control? N  Managing your Medications? N  Managing your Finances? N  Housekeeping or managing your Housekeeping? N  Some recent data might be hidden    Patient Care Team: Dale Antler, MD as PCP - General (Internal Medicine) Benita Gutter, RN as Oncology Nurse Navigator  Indicate any recent Medical Services you may have received from other than [facility] providers in the past year (date may be approximate).     Assessment:   This is a routine wellness examination for Brooke Gallagher.  I connected with Brooke Gallagher today by telephone and verified that I am speaking with the correct person using two identifiers. Location patient: home Location provider: work Persons participating in the virtual  visit: patient, nurse.    I discussed the limitations, risks, security and privacy concerns of performing an evaluation and management service by telephone and the availability of in person appointments. The patient expressed understanding and verbally consented to this telephonic visit.    Interactive audio and video telecommunications were attempted between this provider and patient, however failed, due to patient having technical difficulties OR patient did not have access to video capability.  We continued and completed visit with audio only.  Some vital signs may be absent or patient reported.   Hearing/Vision screen  Hearing Screening   125Hz  250Hz  500Hz  1000Hz  2000Hz  3000Hz  4000Hz  6000Hz  8000Hz   Right ear:           Left ear:           Comments: Patient is able to hear conversational tones without difficulty.  No issues reported.  Vision Screening Comments: Followed by [facility] Wears corrective lenses Visual acuity not assessed, virtual visit.  They have seen their ophthalmologist in the last 12 months.   Dietary issues and exercise activities discussed: Current Exercise Habits: Home exercise routine, Type of exercise: walking, Time  (Minutes): 30, Frequency (Times/Week): 5, Weekly Exercise (Minutes/Week): 150, Intensity: Mild  Lactose free Low salt intake Good water intake  Goals Addressed              This Visit's Progress     Patient Stated   .  I would like to lower my weight (pt-stated)        Depression Screen PHQ 2/9 Scores 12/29/2020 10/09/2020 12/27/2019 03/22/2017 06/22/2016  PHQ - 2 Score 1 1 0 0 0  PHQ- 9 Score - 5 - - -    Fall Risk Fall Risk  12/29/2020 12/11/2020 12/27/2019 06/13/2019 03/22/2017  Falls in the past year? 0 0 0 1 No  Number falls in past yr: 0 - - 0 -  Injury with Fall? 0 - - 1 -  Follow up Falls evaluation completed - Falls evaluation completed Falls evaluation completed -    FALL RISK PREVENTION PERTAINING TO THE HOME: Handrails in use when climbing stairs?Yes Home free of loose throw rugs in walkways, pet beds, electrical cords, etc? Yes  Adequate lighting in your home to reduce risk of falls? Yes   ASSISTIVE DEVICES UTILIZED TO PREVENT FALLS: Life alert? No  Use of a cane, walker or w/c? No   TIMED UP AND GO: Was the test performed? No .   Cognitive Function:  Patient is alert and oriented x3.  Denies difficulty focusing, making decisions, memory loss. MMSE/6CIT deferred. Normal by direct communication/observation.    6CIT Screen 12/27/2019  What Year? 0 points  What month? 0 points  What time? 0 points  Months in reverse 0 points  Repeat phrase 0 points    Immunizations Immunization History  Administered Date(s) Administered  . Fluad Quad(high Dose 65+) 05/21/2019  . Influenza-Unspecified 06/01/2020  . PFIZER(Purple Top)SARS-COV-2 Vaccination 09/27/2019, 10/23/2019, 06/01/2020  . Pneumococcal Conjugate-13 06/13/2019  . Tdap 03/05/2020    Health Maintenance Health Maintenance  Topic Date Due  . DEXA SCAN  Never done  . PNA vac Low Risk Adult (2 of 2 - PPSV23) 11/06/2021 (Originally 06/12/2020)  . MAMMOGRAM  03/30/2022  . COLONOSCOPY (Pts 45-66yrs  Insurance coverage will need to be confirmed)  10/14/2026  . TETANUS/TDAP  03/05/2030  . COVID-19 Vaccine  Completed  . Hepatitis C Screening  Completed  . HPV VACCINES  Aged Out  . INFLUENZA  VACCINE  Discontinued   Colorectal cancer screening: Type of screening: Colonoscopy. Completed 10/13/16. Repeat every 10 years  Mammogram status: Completed 03/30/20. Repeat every year  Dexa Scan- scheduled 12/29/20.  Lung Cancer Screening: (Low Dose CT Chest recommended if Age 35-80 years, 30 pack-year currently smoking OR have quit w/in 15years.) does not qualify.   Vision Screening: Recommended annual ophthalmology exams for early detection of glaucoma and other disorders of the eye. Is the patient up to date with their annual eye exam?  Yes   Dental Screening: Recommended annual dental exams for proper oral hygiene.  Community Resource Referral / Chronic Care Management: CRR required this visit?  No   CCM required this visit?  No      Plan:   Keep all routine maintenance appointments.   I have personally reviewed and noted the following in the patient's chart:   . Medical and social history . Use of alcohol, tobacco or illicit drugs  . Current medications and supplements including opioid prescriptions. Patient is not taking opioids. . Functional ability and status . Nutritional status . Physical activity . Advanced directives . List of other physicians . Hospitalizations, surgeries, and ER visits in previous 12 months . Vitals . Screenings to include cognitive, depression, and falls . Referrals and appointments  In addition, I have reviewed and discussed with patient certain preventive protocols, quality metrics, and best practice recommendations. A written personalized care plan for preventive services as well as general preventive health recommendations were provided to patient via mychart.     Varney Biles, LPN   7/34/1939

## 2021-01-07 ENCOUNTER — Other Ambulatory Visit: Payer: Medicare Other

## 2021-02-01 ENCOUNTER — Other Ambulatory Visit: Payer: Self-pay

## 2021-02-01 ENCOUNTER — Telehealth: Payer: Self-pay | Admitting: Internal Medicine

## 2021-02-01 MED ORDER — AMLODIPINE BESYLATE 2.5 MG PO TABS: 2.5000 mg | ORAL_TABLET | Freq: Every day | ORAL | 2 refills | Status: DC

## 2021-02-01 NOTE — Telephone Encounter (Signed)
Medication has been refilled.

## 2021-02-01 NOTE — Telephone Encounter (Signed)
Pt needs a refill on amLODipine (NORVASC) 2.5 MG tablet sent to [facility]

## 2021-02-24 ENCOUNTER — Other Ambulatory Visit: Payer: Self-pay

## 2021-02-24 ENCOUNTER — Other Ambulatory Visit (INDEPENDENT_AMBULATORY_CARE_PROVIDER_SITE_OTHER): Payer: Medicare Other

## 2021-02-24 DIAGNOSIS — E7800 Pure hypercholesterolemia, unspecified: Secondary | ICD-10-CM | POA: Diagnosis not present

## 2021-02-24 DIAGNOSIS — I10 Essential (primary) hypertension: Secondary | ICD-10-CM

## 2021-02-24 LAB — CBC WITH DIFFERENTIAL/PLATELET
Basophils Absolute: 0 10*3/uL (ref 0.0–0.1)
Basophils Relative: 0.8 % (ref 0.0–3.0)
Eosinophils Absolute: 0 10*3/uL (ref 0.0–0.7)
Eosinophils Relative: 0.5 % (ref 0.0–5.0)
HCT: 38.9 % (ref 36.0–46.0)
Hemoglobin: 13.1 g/dL (ref 12.0–15.0)
Lymphocytes Relative: 27.4 % (ref 12.0–46.0)
Lymphs Abs: 1.6 10*3/uL (ref 0.7–4.0)
MCHC: 33.6 g/dL (ref 30.0–36.0)
MCV: 94.3 fl (ref 78.0–100.0)
Monocytes Absolute: 0.3 10*3/uL (ref 0.1–1.0)
Monocytes Relative: 4.9 % (ref 3.0–12.0)
Neutro Abs: 3.8 10*3/uL (ref 1.4–7.7)
Neutrophils Relative %: 66.4 % (ref 43.0–77.0)
Platelets: 280 10*3/uL (ref 150.0–400.0)
RBC: 4.12 Mil/uL (ref 3.87–5.11)
RDW: 13.4 % (ref 11.5–15.5)
WBC: 5.8 10*3/uL (ref 4.0–10.5)

## 2021-02-24 LAB — BASIC METABOLIC PANEL
BUN: 12 mg/dL (ref 6–23)
CO2: 28 mEq/L (ref 19–32)
Calcium: 9 mg/dL (ref 8.4–10.5)
Chloride: 104 mEq/L (ref 96–112)
Creatinine, Ser: 0.72 mg/dL (ref 0.40–1.20)
GFR: 86.63 mL/min (ref >60.00)
Glucose, Bld: 92 mg/dL (ref 70–99)
Potassium: 4.1 mEq/L (ref 3.5–5.1)
Sodium: 138 mEq/L (ref 135–145)

## 2021-02-24 LAB — LIPID PANEL
Cholesterol: 203 mg/dL — ABNORMAL HIGH (ref 0–200)
HDL: 37.8 mg/dL — ABNORMAL LOW (ref >39.00)
LDL Cholesterol: 142 mg/dL — ABNORMAL HIGH (ref 0–99)
NonHDL: 165.08
Total CHOL/HDL Ratio: 5
Triglycerides: 116 mg/dL (ref 0.0–149.0)
VLDL: 23.2 mg/dL (ref 0.0–40.0)

## 2021-02-24 LAB — HEPATIC FUNCTION PANEL
ALT: 20 U/L (ref 0–35)
AST: 20 U/L (ref 0–37)
Albumin: 4.2 g/dL (ref 3.5–5.2)
Alkaline Phosphatase: 52 U/L (ref 39–117)
Bilirubin, Direct: 0.1 mg/dL (ref 0.0–0.3)
Total Bilirubin: 0.6 mg/dL (ref 0.2–1.2)
Total Protein: 6.7 g/dL (ref 6.0–8.3)

## 2021-02-24 LAB — TSH: TSH: 2.43 u[IU]/mL (ref 0.35–5.50)

## 2021-02-25 ENCOUNTER — Telehealth: Payer: Self-pay

## 2021-02-25 MED ORDER — ROSUVASTATIN CALCIUM 5 MG PO TABS: 5.0000 mg | ORAL_TABLET | Freq: Every day | ORAL | 1 refills | Status: DC

## 2021-02-25 NOTE — Telephone Encounter (Signed)
Crestor medication has been ordered per Dr. Lars Mage orders.

## 2021-02-26 ENCOUNTER — Other Ambulatory Visit: Payer: Self-pay | Admitting: Internal Medicine

## 2021-02-26 DIAGNOSIS — Z1231 Encounter for screening mammogram for malignant neoplasm of breast: Secondary | ICD-10-CM

## 2021-03-23 ENCOUNTER — Other Ambulatory Visit: Payer: Self-pay

## 2021-03-23 ENCOUNTER — Encounter: Payer: Self-pay | Admitting: Internal Medicine

## 2021-03-23 ENCOUNTER — Ambulatory Visit (INDEPENDENT_AMBULATORY_CARE_PROVIDER_SITE_OTHER): Payer: Medicare Other | Admitting: Internal Medicine

## 2021-03-23 VITALS — BP 128/82 | HR 77 | Ht 62.99 in | Wt 161.8 lb

## 2021-03-23 DIAGNOSIS — R059 Cough, unspecified: Secondary | ICD-10-CM

## 2021-03-23 DIAGNOSIS — Z0000 Encounter for general adult medical examination without abnormal findings: Secondary | ICD-10-CM

## 2021-03-23 DIAGNOSIS — F439 Reaction to severe stress, unspecified: Secondary | ICD-10-CM

## 2021-03-23 DIAGNOSIS — Z86010 Personal history of colonic polyps: Secondary | ICD-10-CM

## 2021-03-23 DIAGNOSIS — I10 Essential (primary) hypertension: Secondary | ICD-10-CM

## 2021-03-23 DIAGNOSIS — I700 Atherosclerosis of aorta: Secondary | ICD-10-CM

## 2021-03-23 DIAGNOSIS — H5789 Other specified disorders of eye and adnexa: Secondary | ICD-10-CM

## 2021-03-23 DIAGNOSIS — E7800 Pure hypercholesterolemia, unspecified: Secondary | ICD-10-CM

## 2021-03-23 MED ORDER — AMLODIPINE BESYLATE 5 MG PO TABS: 5.0000 mg | ORAL_TABLET | Freq: Every day | ORAL | 1 refills | Status: DC

## 2021-03-23 NOTE — Assessment & Plan Note (Signed)
Physical today 03/23/21.  Mammogram 03/30/20 - Birads I.  Colonoscopy 03/2017 - recommended f/u in 5 years.  PAP 06/13/19 - negative with negative HPV.

## 2021-03-23 NOTE — Progress Notes (Signed)
Subjective:    Patient ID: Brooke Gallagher, female    DOB: 1955/08/30, 65 y.o.   MRN: [id number]  HPI This visit occurred during the SARS-CoV-2 public health emergency.  Safety protocols were in place, including screening questions prior to the visit, additional usage of staff PPE, and extensive cleaning of exam room while observing appropriate contact time as indicated for disinfecting solutions.   Patient with past history of hypertension.  She comes in today to follow up on these issues as well as for a complete physical exam.  She was recently started on amlodipine.  Continues on lisinopril.  Blood pressures had been doing better.  Recent checks - elevated - averaging 120-upper 130s/70-90.  Trying to stay active.  No chest pain reported.  Breathing stable.  No increased cough or congestion.  No increased abdominal pain reported.  No bowel issues reported.  No sick contacts.  No fever.  No nausea or vomiting.  Does report redness - right eye.  Some blurred vision.  Noticed two days ago.  Some discomfort - corner of her eye.  Denies any sinus congestion.  No chest congestion.  Some cough in the room that she relates to wearing a mask, but denies any cough or congestion prior.  No fever.  No body aches.   Past Medical History:  Diagnosis Date   Allergy    Colon polyp    Depression    Herpes    Hx: UTI (urinary tract infection)    Hyperlipidemia    Hypertension    Past Surgical History:  Procedure Laterality Date   ABDOMINAL HYSTERECTOMY     BACK SURGERY  1998   lower back   LAPAROSCOPIC BILATERAL SALPINGO OOPHERECTOMY Bilateral 01/22/2020   Procedure: LAPAROSCOPIC BILATERAL SALPINGO OOPHORECTOMY;  Surgeon: Mellody Drown, MD;  Location: [facility] ORS;  Service: Gynecology;  Laterality: Bilateral;   OOPHORECTOMY     TONSILLECTOMY  1963   Family History  Problem Relation Age of Onset   Arthritis Mother    Stroke Mother    Hypertension Mother    Alzheimer's disease Mother    Dementia  Mother    Colon cancer Father    Prostate cancer Father    Mental illness Sister    Alcohol abuse Brother    Congestive Heart Failure Brother    Colon cancer Paternal Uncle    Colon cancer Maternal Grandmother    Mental illness Maternal Grandmother    Alzheimer's disease Maternal Grandmother    Hyperlipidemia Maternal Grandfather    Hypertension Maternal Grandfather    Diabetes Maternal Grandfather    Arthritis Paternal Grandmother    Alcohol abuse Paternal Grandfather    Breast cancer Neg Hx    Social History   Socioeconomic History   Marital status: Single    Spouse name: Not on file   Number of children: Not on file   Years of education: Not on file   Highest education level: Not on file  Occupational History   Not on file  Tobacco Use   Smoking status: Never   Smokeless tobacco: Never  Vaping Use   Vaping Use: Never used  Substance and Sexual Activity   Alcohol use: Yes    Alcohol/week: 1.0 - 2.0 standard drink    Types: 1 - 2 Standard drinks or equivalent per week   Drug use: No   Sexual activity: Not Currently  Other Topics Concern   Not on file  Social History Narrative   Not on file  Social Determinants of Health   Financial Resource Strain: Low Risk    Difficulty of Paying Living Expenses: Not hard at all  Food Insecurity: No Food Insecurity   Worried About Charity fundraiser in the Last Year: Never true   [place] in the Last Year: Never true  Transportation Needs: No Transportation Needs   Lack of Transportation (Medical): No   Lack of Transportation (Non-Medical): No  Physical Activity: Sufficiently Active   Days of Exercise per Week: 5 days   Minutes of Exercise per Session: 30 min  Stress: Stress Concern Present   Feeling of Stress : To some extent  Social Connections: Unknown   Frequency of Communication with Friends and Family: More than three times a week   Frequency of Social Gatherings with Friends and Family: More than three  times a week   Attends Religious Services: More than 4 times per year   Active Member of [employer] or Organizations: Not on file   Attends Archivist Meetings: Not on file   Marital Status: Not on file    Review of Systems  Constitutional:  Negative for appetite change and unexpected weight change.  HENT:  Negative for congestion and sinus pressure.   Eyes:  Positive for redness. Negative for discharge.  Respiratory:  Negative for chest tightness and shortness of breath.        Some cough in the room.  She relates to wearing a mask.    Cardiovascular:  Negative for chest pain, palpitations and leg swelling.  Gastrointestinal:  Negative for abdominal pain, diarrhea, nausea and vomiting.  Genitourinary:  Negative for difficulty urinating and dysuria.  Musculoskeletal:  Negative for joint swelling and myalgias.  Skin:  Negative for color change and rash.  Neurological:  Negative for dizziness, light-headedness and headaches.  Psychiatric/Behavioral:  Negative for agitation and dysphoric mood.       Objective:    Physical Exam Vitals reviewed.  Constitutional:      General: She is not in acute distress.    Appearance: Normal appearance. She is well-developed.  HENT:     Head: Normocephalic and atraumatic.     Right Ear: External ear normal.     Left Ear: External ear normal.  Eyes:     General: No scleral icterus.       Right eye: No discharge.        Left eye: No discharge.     Comments: Right eye - red.  Appears to be c/w subconjunctival hemorrhage.    Neck:     Thyroid: No thyromegaly.  Cardiovascular:     Rate and Rhythm: Normal rate and regular rhythm.  Pulmonary:     Effort: No tachypnea, accessory muscle usage or respiratory distress.     Breath sounds: Normal breath sounds. No decreased breath sounds, wheezing or rhonchi.  Chest:  Breasts:    Right: No inverted nipple, mass, nipple discharge or tenderness (no axillary adenopathy).     Left: No inverted  nipple, mass, nipple discharge or tenderness (no axilarry adenopathy).  Abdominal:     General: Bowel sounds are normal.     Palpations: Abdomen is soft.     Tenderness: There is no abdominal tenderness.  Musculoskeletal:        General: No swelling or tenderness.     Cervical back: Neck supple.  Lymphadenopathy:     Cervical: No cervical adenopathy.  Skin:    General: Skin is warm.  Findings: No erythema or rash.  Neurological:     Mental Status: She is alert and oriented to person, place, and time.  Psychiatric:        Mood and Affect: Mood normal.        Behavior: Behavior normal.    BP 128/82   Pulse 77   Ht 5' 2.99" (1.6 m)   Wt 161 lb 12.8 oz (73.4 kg)   SpO2 99%   BMI 28.67 kg/m  Wt Readings from Last 3 Encounters:  03/23/21 161 lb 12.8 oz (73.4 kg)  12/29/20 156 lb (70.8 kg)  12/11/20 156 lb 9.6 oz (71 kg)    Outpatient Encounter Medications as of 03/23/2021  Medication Sig   acetaminophen (TYLENOL) 325 MG tablet Take 650 mg by mouth as needed.   amLODipine (NORVASC) 5 MG tablet Take 1 tablet (5 mg total) by mouth daily.   lisinopril (ZESTRIL) 20 MG tablet Take 1 tablet (20 mg total) by mouth daily.   loratadine (CLARITIN) 10 MG tablet Take 10 mg by mouth daily.   Melatonin 10 MG TABS Take 20 mg by mouth at bedtime as needed (sleep).    Multiple Vitamin (MULTIVITAMIN WITH MINERALS) TABS tablet Take 1 tablet by mouth daily. Women's Multivitamin   rosuvastatin (CRESTOR) 5 MG tablet Take 1 tablet (5 mg total) by mouth daily.   traZODone (DESYREL) 50 MG tablet Take 0.5-1 tablets (25-50 mg total) by mouth at bedtime as needed for sleep.   [DISCONTINUED] amLODipine (NORVASC) 2.5 MG tablet Take 1 tablet (2.5 mg total) by mouth daily.   No facility-administered encounter medications on file as of 03/23/2021.     Lab Results  Component Value Date   WBC 5.8 02/24/2021   HGB 13.1 02/24/2021   HCT 38.9 02/24/2021   PLT 280.0 02/24/2021   GLUCOSE 92 02/24/2021   CHOL  203 (H) 02/24/2021   TRIG 116.0 02/24/2021   HDL 37.80 (L) 02/24/2021   LDLDIRECT 146.7 05/03/2013   LDLCALC 142 (H) 02/24/2021   ALT 20 02/24/2021   AST 20 02/24/2021   NA 138 02/24/2021   K 4.1 02/24/2021   CL 104 02/24/2021   CREATININE 0.72 02/24/2021   BUN 12 02/24/2021   CO2 28 02/24/2021   TSH 2.43 02/24/2021    MM 3D SCREEN BREAST BILATERAL  Result Date: 03/30/2020 CLINICAL DATA:  Screening. EXAM: DIGITAL SCREENING BILATERAL MAMMOGRAM WITH TOMO AND CAD COMPARISON:  Previous exam(s). ACR Breast Density Category b: There are scattered areas of fibroglandular density. FINDINGS: There are no findings suspicious for malignancy. Images were processed with CAD. IMPRESSION: No mammographic evidence of malignancy. A result letter of this screening mammogram will be mailed directly to the patient. RECOMMENDATION: Screening mammogram in one year. (Code:SM-B-01Y) BI-RADS CATEGORY  1: Negative. Electronically Signed   By: Ammie Ferrier M.D.   On: 03/30/2020 14:33       Assessment & Plan:   Problem List Items Addressed This Visit     Cough - Primary    Some cough in the room.  She denies previous cough.  Given eye changes and cough- will covid test.  Consider steroid nasal spray.  Follow. No sob.  No chest pain reported.       Relevant Orders   Novel Coronavirus, NAA ([facility]) (Completed)   Essential hypertension, benign    Blood pressure remains elevated.  On lisinopril and amlodipine.  Increase amlodipine to '5mg'$  q day.  Follow pressures.  Follow metabolic panel.  Relevant Medications   amLODipine (NORVASC) 5 MG tablet   Healthcare maintenance    Physical today 03/23/21.  Mammogram 03/30/20 - Birads I.  Colonoscopy 03/2017 - recommended f/u in 5 years.  PAP 06/13/19 - negative with negative HPV.       History of colonic polyps    Colonoscopy 10/2016 - diverticulosis and internal hemorrhoids.  Follow up colonoscopy 5 years after last.       Hypercholesterolemia    The  10-year ASCVD risk score Mikey Bussing DC Jr., et al., 2013) is: 10.4%   Values used to calculate the score:     Age: 36 years     Sex: Female     Is Non-Hispanic African American: No     Diabetic: No     Tobacco smoker: No     Systolic Blood Pressure: 0000000 mmHg     Is BP treated: Yes     HDL Cholesterol: 37.8 mg/dL     Total Cholesterol: 203 mg/dL  Low cholesterol diet and exercise.  Consider statin medication.       Relevant Medications   amLODipine (NORVASC) 5 MG tablet   Red eye    Eye appears to be c/w subconjunctival hemorrhage.  Describes some blurred vision.  Discussed need for eye evaluation.  She will call back today with name of eye MD she prefers to see.        Stress    Overall appears to be handling things well.  Follow.          Einar Pheasant, MD

## 2021-03-23 NOTE — Assessment & Plan Note (Addendum)
The 10-year ASCVD risk score Mikey Bussing DC Brooke Bonito., et al., 2013) is: 10.4%   Values used to calculate the score:     Age: 65 years     Sex: Female     Is Non-Hispanic African American: No     Diabetic: No     Tobacco smoker: No     Systolic Blood Pressure: 0000000 mmHg     Is BP treated: Yes     HDL Cholesterol: 37.8 mg/dL     Total Cholesterol: 203 mg/dL  Low cholesterol diet and exercise.  Consider statin medication.

## 2021-03-23 NOTE — Patient Instructions (Signed)
Increase amlodipine to 5mg per day.   

## 2021-03-24 LAB — SARS-COV-2, NAA 2 DAY TAT

## 2021-03-24 LAB — NOVEL CORONAVIRUS, NAA: SARS-CoV-2, NAA: NOT DETECTED

## 2021-03-29 ENCOUNTER — Encounter: Payer: Self-pay | Admitting: Internal Medicine

## 2021-03-29 DIAGNOSIS — H5789 Other specified disorders of eye and adnexa: Secondary | ICD-10-CM | POA: Insufficient documentation

## 2021-03-29 DIAGNOSIS — R059 Cough, unspecified: Secondary | ICD-10-CM | POA: Insufficient documentation

## 2021-03-29 DIAGNOSIS — I700 Atherosclerosis of aorta: Secondary | ICD-10-CM | POA: Insufficient documentation

## 2021-03-29 NOTE — Assessment & Plan Note (Signed)
Low cholesterol diet and exercise.  Consider statin treatment.

## 2021-03-29 NOTE — Assessment & Plan Note (Signed)
Colonoscopy 10/2016 - diverticulosis and internal hemorrhoids.  Follow up colonoscopy 5 years after last.

## 2021-03-29 NOTE — Assessment & Plan Note (Signed)
Some cough in the room.  She denies previous cough.  Given eye changes and cough- will covid test.  Consider steroid nasal spray.  Follow. No sob.  No chest pain reported.

## 2021-03-29 NOTE — Assessment & Plan Note (Signed)
Overall appears to be handling things well.  Follow.  ?

## 2021-03-29 NOTE — Assessment & Plan Note (Signed)
Blood pressure remains elevated.  On lisinopril and amlodipine.  Increase amlodipine to '5mg'$  q day.  Follow pressures.  Follow metabolic panel.

## 2021-03-29 NOTE — Assessment & Plan Note (Signed)
Eye appears to be c/w subconjunctival hemorrhage.  Describes some blurred vision.  Discussed need for eye evaluation.  She will call back today with name of eye MD she prefers to see.

## 2021-03-31 ENCOUNTER — Ambulatory Visit
Admission: RE | Admit: 2021-03-31 | Discharge: 2021-03-31 | Disposition: A | Discharge status: Home / Self Care | Payer: Medicare Other | Source: Ambulatory Visit | Attending: Internal Medicine | Admitting: Internal Medicine

## 2021-03-31 ENCOUNTER — Telehealth: Payer: Self-pay

## 2021-03-31 ENCOUNTER — Other Ambulatory Visit: Payer: Self-pay

## 2021-03-31 DIAGNOSIS — Z1231 Encounter for screening mammogram for malignant neoplasm of breast: Secondary | ICD-10-CM | POA: Insufficient documentation

## 2021-03-31 DIAGNOSIS — F4321 Adjustment disorder with depressed mood: Secondary | ICD-10-CM

## 2021-03-31 MED ORDER — TRAZODONE HCL 50 MG PO TABS: 25.0000 mg | ORAL_TABLET | Freq: Every evening | ORAL | 3 refills | Status: DC | PRN

## 2021-03-31 NOTE — Telephone Encounter (Signed)
Pt needs a refill on traZODone (DESYREL) 50 MG tablet

## 2021-04-01 ENCOUNTER — Other Ambulatory Visit: Payer: Self-pay | Admitting: Internal Medicine

## 2021-04-01 DIAGNOSIS — R928 Other abnormal and inconclusive findings on diagnostic imaging of breast: Secondary | ICD-10-CM

## 2021-04-01 DIAGNOSIS — N6320 Unspecified lump in the left breast, unspecified quadrant: Secondary | ICD-10-CM

## 2021-04-02 NOTE — Progress Notes (Signed)
Left message for patient to return call back for lab results.

## 2021-04-08 ENCOUNTER — Other Ambulatory Visit: Payer: Self-pay

## 2021-04-08 ENCOUNTER — Ambulatory Visit
Admission: RE | Admit: 2021-04-08 | Discharge: 2021-04-08 | Disposition: A | Discharge status: Home / Self Care | Payer: Medicare Other | Source: Ambulatory Visit | Attending: Internal Medicine | Admitting: Internal Medicine

## 2021-04-08 ENCOUNTER — Other Ambulatory Visit (INDEPENDENT_AMBULATORY_CARE_PROVIDER_SITE_OTHER): Payer: Medicare Other

## 2021-04-08 DIAGNOSIS — N6320 Unspecified lump in the left breast, unspecified quadrant: Secondary | ICD-10-CM | POA: Diagnosis present

## 2021-04-08 DIAGNOSIS — R928 Other abnormal and inconclusive findings on diagnostic imaging of breast: Secondary | ICD-10-CM

## 2021-04-08 DIAGNOSIS — E7800 Pure hypercholesterolemia, unspecified: Secondary | ICD-10-CM | POA: Diagnosis not present

## 2021-04-08 LAB — HEPATIC FUNCTION PANEL
ALT: 26 U/L (ref 0–35)
AST: 24 U/L (ref 0–37)
Albumin: 4.2 g/dL (ref 3.5–5.2)
Alkaline Phosphatase: 46 U/L (ref 39–117)
Bilirubin, Direct: 0.1 mg/dL (ref 0.0–0.3)
Total Bilirubin: 0.4 mg/dL (ref 0.2–1.2)
Total Protein: 7.1 g/dL (ref 6.0–8.3)

## 2021-04-08 NOTE — Addendum Note (Signed)
Addended by: Lars Masson on: 04/08/2021 07:43 AM   Modules accepted: Orders

## 2021-04-09 ENCOUNTER — Other Ambulatory Visit: Payer: Self-pay | Admitting: Internal Medicine

## 2021-04-09 DIAGNOSIS — R928 Other abnormal and inconclusive findings on diagnostic imaging of breast: Secondary | ICD-10-CM

## 2021-04-09 NOTE — Progress Notes (Signed)
Order placed for surgery referral.  

## 2021-04-13 ENCOUNTER — Other Ambulatory Visit: Payer: Self-pay | Admitting: General Surgery

## 2021-04-13 DIAGNOSIS — R928 Other abnormal and inconclusive findings on diagnostic imaging of breast: Secondary | ICD-10-CM

## 2021-04-22 ENCOUNTER — Ambulatory Visit
Admission: RE | Admit: 2021-04-22 | Discharge: 2021-04-22 | Disposition: A | Discharge status: Home / Self Care | Payer: Medicare Other | Source: Ambulatory Visit | Attending: General Surgery | Admitting: General Surgery

## 2021-04-22 ENCOUNTER — Other Ambulatory Visit: Payer: Self-pay

## 2021-04-22 DIAGNOSIS — R928 Other abnormal and inconclusive findings on diagnostic imaging of breast: Secondary | ICD-10-CM | POA: Diagnosis present

## 2021-04-22 HISTORY — PX: BREAST BIOPSY: SHX20

## 2021-04-23 LAB — SURGICAL PATHOLOGY

## 2021-05-21 ENCOUNTER — Telehealth: Payer: Self-pay | Admitting: Internal Medicine

## 2021-05-21 NOTE — Telephone Encounter (Signed)
Lft pt vm to call [facility] to sch appt for dexa scan. thanks

## 2021-05-27 ENCOUNTER — Other Ambulatory Visit: Payer: Self-pay

## 2021-05-27 DIAGNOSIS — I10 Essential (primary) hypertension: Secondary | ICD-10-CM

## 2021-05-27 MED ORDER — LISINOPRIL 20 MG PO TABS: 20.0000 mg | ORAL_TABLET | Freq: Every day | ORAL | 1 refills | Status: DC

## 2021-06-17 ENCOUNTER — Ambulatory Visit
Admission: RE | Admit: 2021-06-17 | Discharge: 2021-06-17 | Disposition: A | Discharge status: Home / Self Care | Payer: Medicare Other | Source: Ambulatory Visit | Attending: Internal Medicine | Admitting: Internal Medicine

## 2021-06-17 ENCOUNTER — Other Ambulatory Visit: Payer: Self-pay

## 2021-06-17 DIAGNOSIS — Z780 Asymptomatic menopausal state: Secondary | ICD-10-CM | POA: Insufficient documentation

## 2021-07-14 ENCOUNTER — Encounter: Payer: Self-pay | Admitting: Internal Medicine

## 2021-07-14 DIAGNOSIS — R928 Other abnormal and inconclusive findings on diagnostic imaging of breast: Secondary | ICD-10-CM | POA: Insufficient documentation

## 2021-07-19 ENCOUNTER — Telehealth: Payer: Self-pay | Admitting: Internal Medicine

## 2021-07-19 NOTE — Telephone Encounter (Signed)
Called to schedule follow up per Dr. Nicki Reaper. Patient stated that she was at work and she did not have her calendar . She will call to schedule.

## 2021-08-10 ENCOUNTER — Telehealth: Payer: Self-pay | Admitting: Internal Medicine

## 2021-08-10 ENCOUNTER — Other Ambulatory Visit: Payer: Self-pay

## 2021-08-10 DIAGNOSIS — F4321 Adjustment disorder with depressed mood: Secondary | ICD-10-CM

## 2021-08-10 MED ORDER — TRAZODONE HCL 50 MG PO TABS: 25.0000 mg | ORAL_TABLET | Freq: Every evening | ORAL | 3 refills | Status: DC | PRN

## 2021-08-10 NOTE — Telephone Encounter (Signed)
Patient aware that medication has been sent in.

## 2021-08-10 NOTE — Telephone Encounter (Signed)
Patient called in needing refill of Trazodone 50mg . Need to be sent [facility] on [address] # [phone]

## 2021-08-24 ENCOUNTER — Telehealth: Payer: Self-pay | Admitting: Internal Medicine

## 2021-08-24 ENCOUNTER — Other Ambulatory Visit: Payer: Self-pay

## 2021-08-24 MED ORDER — ROSUVASTATIN CALCIUM 5 MG PO TABS: 5.0000 mg | ORAL_TABLET | Freq: Every day | ORAL | 1 refills | Status: DC

## 2021-08-24 NOTE — Telephone Encounter (Signed)
Medication refilled

## 2021-08-24 NOTE — Telephone Encounter (Signed)
Pt is requesting medication refill for rosuvastatin (CRESTOR) 5 MG tablet Pt uses [facility] on [address].

## 2021-08-25 ENCOUNTER — Ambulatory Visit (INDEPENDENT_AMBULATORY_CARE_PROVIDER_SITE_OTHER): Payer: Medicare Other | Admitting: Internal Medicine

## 2021-08-25 ENCOUNTER — Other Ambulatory Visit: Payer: Self-pay

## 2021-08-25 ENCOUNTER — Encounter: Payer: Self-pay | Admitting: Internal Medicine

## 2021-08-25 VITALS — BP 110/80 | HR 77 | Temp 98.6°F | Ht 63.0 in | Wt 162.8 lb

## 2021-08-25 DIAGNOSIS — I700 Atherosclerosis of aorta: Secondary | ICD-10-CM

## 2021-08-25 DIAGNOSIS — Z23 Encounter for immunization: Secondary | ICD-10-CM

## 2021-08-25 DIAGNOSIS — Z860100 Personal history of colon polyps, unspecified: Secondary | ICD-10-CM

## 2021-08-25 DIAGNOSIS — I10 Essential (primary) hypertension: Secondary | ICD-10-CM | POA: Diagnosis not present

## 2021-08-25 DIAGNOSIS — E7800 Pure hypercholesterolemia, unspecified: Secondary | ICD-10-CM

## 2021-08-25 DIAGNOSIS — R928 Other abnormal and inconclusive findings on diagnostic imaging of breast: Secondary | ICD-10-CM

## 2021-08-25 DIAGNOSIS — R635 Abnormal weight gain: Secondary | ICD-10-CM

## 2021-08-25 DIAGNOSIS — F439 Reaction to severe stress, unspecified: Secondary | ICD-10-CM

## 2021-08-25 NOTE — Progress Notes (Addendum)
Patient ID: Brooke Gallagher, female   DOB: [birth year], 66 y.o.   MRN: [id number]   Subjective:    Patient ID: Brooke Gallagher, female    DOB: [birth year], 66 y.o.   MRN: [id number]  This visit occurred during the SARS-CoV-2 public health emergency.  Safety protocols were in place, including screening questions prior to the visit, additional usage of staff PPE, and extensive cleaning of exam room while observing appropriate contact time as indicated for disinfecting solutions.   Patient here for a scheduled follow up.   HPI Here to follow up regarding her blood pressure.  Reviewed outside blood pressure checks.  Blood pressures reviewed (outside checks) - mostly averaging 116-130/70-80s.  On amlodipine.  No chest pain or sob reported.  Daily walks.  Doing exercise videos.  No acid reflux or abdominal pain reported.  Discussed diet and exercise.     Past Medical History:  Diagnosis Date   Allergy    Colon polyp    Depression    Herpes    Hx: UTI (urinary tract infection)    Hyperlipidemia    Hypertension    Past Surgical History:  Procedure Laterality Date   ABDOMINAL HYSTERECTOMY     BACK SURGERY  1998   lower back   BREAST BIOPSY Left 04/22/2021   [place] Bx, path pending/ ribbon clip   LAPAROSCOPIC BILATERAL SALPINGO OOPHERECTOMY Bilateral 01/22/2020   Procedure: LAPAROSCOPIC BILATERAL SALPINGO OOPHORECTOMY;  Surgeon: Mellody Drown, MD;  Location: [facility] ORS;  Service: Gynecology;  Laterality: Bilateral;   OOPHORECTOMY     TONSILLECTOMY  1963   Family History  Problem Relation Age of Onset   Arthritis Mother    Stroke Mother    Hypertension Mother    Alzheimer's disease Mother    Dementia Mother    Colon cancer Father    Prostate cancer Father    Mental illness Sister    Alcohol abuse Brother    Congestive Heart Failure Brother    Colon cancer Paternal Uncle    Colon cancer Maternal Grandmother    Mental illness Maternal Grandmother    Alzheimer's disease Maternal  Grandmother    Hyperlipidemia Maternal Grandfather    Hypertension Maternal Grandfather    Diabetes Maternal Grandfather    Arthritis Paternal Grandmother    Alcohol abuse Paternal Grandfather    Breast cancer Neg Hx    Social History   Socioeconomic History   Marital status: Single    Spouse name: Not on file   Number of children: Not on file   Years of education: Not on file   Highest education level: Not on file  Occupational History   Not on file  Tobacco Use   Smoking status: Never   Smokeless tobacco: Never  Vaping Use   Vaping Use: Never used  Substance and Sexual Activity   Alcohol use: Yes    Alcohol/week: 1.0 - 2.0 standard drink    Types: 1 - 2 Standard drinks or equivalent per week   Drug use: No   Sexual activity: Not Currently  Other Topics Concern   Not on file  Social History Narrative   Not on file   Social Determinants of Health   Financial Resource Strain: Low Risk    Difficulty of Paying Living Expenses: Not hard at all  Food Insecurity: No Food Insecurity   Worried About Charity fundraiser in the Last Year: Never true   [place] in the Last Year: Never true  Transportation  Needs: No Transportation Needs   Lack of Transportation (Medical): No   Lack of Transportation (Non-Medical): No  Physical Activity: Sufficiently Active   Days of Exercise per Week: 5 days   Minutes of Exercise per Session: 30 min  Stress: Stress Concern Present   Feeling of Stress : To some extent  Social Connections: Unknown   Frequency of Communication with Friends and Family: More than three times a week   Frequency of Social Gatherings with Friends and Family: More than three times a week   Attends Religious Services: More than 4 times per year   Active Member of [employer] or Organizations: Not on file   Attends Archivist Meetings: Not on file   Marital Status: Not on file     Review of Systems  Constitutional:  Negative for appetite change and  unexpected weight change.  HENT:  Negative for congestion and sinus pressure.   Respiratory:  Negative for cough, chest tightness and shortness of breath.   Cardiovascular:  Negative for chest pain, palpitations and leg swelling.  Gastrointestinal:  Negative for abdominal pain, diarrhea, nausea and vomiting.  Genitourinary:  Negative for difficulty urinating and dysuria.  Musculoskeletal:  Negative for joint swelling and myalgias.  Skin:  Negative for color change and rash.  Neurological:  Negative for dizziness, light-headedness and headaches.  Psychiatric/Behavioral:  Negative for agitation and dysphoric mood.       Objective:     BP 110/80 (BP Location: Left Arm, Patient Position: Sitting, Cuff Size: Normal)    Pulse 77    Temp 98.6 F (37 C) (Oral)    Ht 5\' 3"  (1.6 m)    Wt 162 lb 12.8 oz (73.8 kg)    SpO2 97%    BMI 28.84 kg/m  Wt Readings from Last 3 Encounters:  08/25/21 162 lb 12.8 oz (73.8 kg)  03/23/21 161 lb 12.8 oz (73.4 kg)  12/29/20 156 lb (70.8 kg)    Physical Exam Vitals reviewed.  Constitutional:      General: She is not in acute distress.    Appearance: Normal appearance.  HENT:     Head: Normocephalic and atraumatic.     Right Ear: External ear normal.     Left Ear: External ear normal.  Eyes:     General: No scleral icterus.       Right eye: No discharge.        Left eye: No discharge.     Conjunctiva/sclera: Conjunctivae normal.  Neck:     Thyroid: No thyromegaly.  Cardiovascular:     Rate and Rhythm: Normal rate and regular rhythm.  Pulmonary:     Effort: No respiratory distress.     Breath sounds: Normal breath sounds. No wheezing.  Abdominal:     General: Bowel sounds are normal.     Palpations: Abdomen is soft.     Tenderness: There is no abdominal tenderness.  Musculoskeletal:        General: No swelling or tenderness.     Cervical back: Neck supple. No tenderness.  Lymphadenopathy:     Cervical: No cervical adenopathy.  Skin:     Findings: No erythema or rash.  Neurological:     Mental Status: She is alert.  Psychiatric:        Mood and Affect: Mood normal.        Behavior: Behavior normal.     Outpatient Encounter Medications as of 08/25/2021  Medication Sig   acetaminophen (TYLENOL) 325 MG tablet Take  650 mg by mouth as needed.   amLODipine (NORVASC) 5 MG tablet Take 1 tablet (5 mg total) by mouth daily.   lisinopril (ZESTRIL) 20 MG tablet Take 1 tablet (20 mg total) by mouth daily.   loratadine (CLARITIN) 10 MG tablet Take 10 mg by mouth daily.   Melatonin 10 MG TABS Take 20 mg by mouth at bedtime as needed (sleep).    Multiple Vitamin (MULTIVITAMIN WITH MINERALS) TABS tablet Take 1 tablet by mouth daily. Women's Multivitamin   rosuvastatin (CRESTOR) 5 MG tablet Take 1 tablet (5 mg total) by mouth daily.   traZODone (DESYREL) 50 MG tablet Take 0.5-1 tablets (25-50 mg total) by mouth at bedtime as needed for sleep.   No facility-administered encounter medications on file as of 08/25/2021.     Lab Results  Component Value Date   WBC 5.8 02/24/2021   HGB 13.1 02/24/2021   HCT 38.9 02/24/2021   PLT 280.0 02/24/2021   GLUCOSE 92 02/24/2021   CHOL 203 (H) 02/24/2021   TRIG 116.0 02/24/2021   HDL 37.80 (L) 02/24/2021   LDLDIRECT 146.7 05/03/2013   LDLCALC 142 (H) 02/24/2021   ALT 26 04/08/2021   AST 24 04/08/2021   NA 138 02/24/2021   K 4.1 02/24/2021   CL 104 02/24/2021   CREATININE 0.72 02/24/2021   BUN 12 02/24/2021   CO2 28 02/24/2021   TSH 2.43 02/24/2021    DEXAScan  Result Date: 06/17/2021 EXAM: DUAL X-RAY ABSORPTIOMETRY (DXA) FOR BONE MINERAL DENSITY IMPRESSION: Your patient Kaydynce Pat completed a BMD test on 06/17/2021 using the [place] (software version: 14.10) manufactured by [employer]. The following summarizes the results of our evaluation. Technologist: crr PATIENT BIOGRAPHICAL: Name: Amore, Ackman Patient ID: [id number] Birth Date: 06-21-1956 Height: 62.0  in. Gender: Female Exam Date: 06/17/2021 Weight: 161.9 lbs. Indications: Caucasian, Height Loss, History of Fracture (Adult), Hysterectomy, Oophorectomy Bilateral, Postmenopausal Fractures: fingers Treatments: Calcium, Multi-Vitamin, Vitamin D DENSITOMETRY RESULTS: Site      Region     Measured Date Measured Age WHO Classification Young Adult T-score BMD         %Change vs. Previous Significant Change (*) AP Spine L1-L4 4/17/[age] 67.5 Normal -0.3 1.160 g/cm2 - - DualFemur Neck Right 4/17/2019 67.5 Normal -0.9 0.917 g/cm2 - - ASSESSMENT: The BMD measured at Femur Neck Right is 0.917 g/cm2 with a T-score of -0.9. This patient is considered normal according to [facility]) criteria. The scan quality is good. World Pharmacologist [facility]) criteria for post-menopausal, Caucasian Women: Normal:                   T-score at or above -1 SD Osteopenia/low bone mass: T-score between -1 and -2.5 SD Osteoporosis:             T-score at or below -2.5 SD RECOMMENDATIONS: 1. All patients should optimize calcium and vitamin D intake. 2. Consider FDA-approved medical therapies in postmenopausal women and men aged 63 years and older, based on the following: a. A hip or vertebral(clinical or morphometric) fracture b. T-score < -2.5 at the femoral neck or spine after appropriate evaluation to exclude secondary causes c. Low bone mass (T-score between -1.0 and -2.5 at the femoral neck or spine) and a 10-year probability of a hip fracture > 3% or a 10-year probability of a major osteoporosis-related fracture > 20% based on the [place]-adapted WHO algorithm 3. Clinician judgment and/or patient preferences may indicate treatment for people with  10-year fracture probabilities above or below these levels FOLLOW-UP: People with diagnosed cases of osteoporosis or at high risk for fracture should have regular bone mineral density tests. For patients eligible for Medicare, routine testing is allowed once every 2 years. The  testing frequency can be increased to one year for patients who have rapidly progressing disease, those who are receiving or discontinuing medical therapy to restore bone mass, or have additional risk factors. I have reviewed this report, and agree with the above findings. Mark A. Thornton Papas, M.D. [facility] Electronically Signed   By: Lavonia Dana M.D.   On: 06/17/2021 11:58       Assessment & Plan:   Problem List Items Addressed This Visit     Abnormal mammogram    The patient had an abnormal mammogram at the end of August. Subsequent ultrasound-guided core biopsy showed benign breast tissue and postbiopsy imaging showed the clip about 2 cm away from the index lesion on her original screening and diagnostic studies, which after biopsy was no longer evident. A 52-month follow-up diagnostic mammogram has been recommended by the attending radiologist.  Seeing Dr Bary Castilla        Aortic atherosclerosis ([place])    Crestor.        Essential hypertension, benign - Primary    Blood pressure better as outlined.  Continue lisinopril and amlodipine. Blood pressure rechecked by me 126/82.  Follow pressures.  Follow metabolic panel.        Relevant Orders   Basic metabolic panel   History of colonic polyps    Colonoscopy 10/2016 - diverticulosis and internal hemorrhoids.  Follow up colonoscopy 5 years after last.       Hypercholesterolemia    The 10-year ASCVD risk score (Arnett DK, et al., 2019) is: 7.8%   Values used to calculate the score:     Age: 31 years     Sex: Female     Is Non-Hispanic African American: No     Diabetic: No     Tobacco smoker: No     Systolic Blood Pressure: 326 mmHg     Is BP treated: Yes     HDL Cholesterol: 37.8 mg/dL     Total Cholesterol: 203 mg/dL  Low cholesterol diet and exercise.  Crestor.       Relevant Orders   Hepatic function panel   Lipid panel   Stress    Overall appears to be handling things well.  Follow.        Weight gain     Weight stable from last check.  Increased from check prior.  Discussed diet and exercise.  [facility] Lipid diet.        Other Visit Diagnoses     Need for pneumococcal vaccination       Relevant Orders   Pneumococcal polysaccharide vaccine 23-valent greater than or equal to 2yo subcutaneous/IM (Completed)        Einar Pheasant, MD

## 2021-08-29 ENCOUNTER — Telehealth: Payer: Self-pay | Admitting: Internal Medicine

## 2021-08-29 ENCOUNTER — Encounter: Payer: Self-pay | Admitting: Internal Medicine

## 2021-08-29 NOTE — Assessment & Plan Note (Signed)
The 10-year ASCVD risk score (Arnett DK, et al., 2019) is: 7.8%   Values used to calculate the score:     Age: 66 years     Sex: Female     Is Non-Hispanic African American: No     Diabetic: No     Tobacco smoker: No     Systolic Blood Pressure: 638 mmHg     Is BP treated: Yes     HDL Cholesterol: 37.8 mg/dL     Total Cholesterol: 203 mg/dL  Low cholesterol diet and exercise.  Crestor.

## 2021-08-29 NOTE — Assessment & Plan Note (Signed)
The patient had an abnormal mammogram at the end of August. Subsequent ultrasound-guided core biopsy showed benign breast tissue and postbiopsy imaging showed the clip about 2 cm away from the index lesion on her original screening and diagnostic studies, which after biopsy was no longer evident. A 51-month follow-up diagnostic mammogram has been recommended by the attending radiologist.  Seeing Dr Bary Castilla

## 2021-08-29 NOTE — Assessment & Plan Note (Signed)
Weight stable from last check.  Increased from check prior.  Discussed diet and exercise.  [facility] Lipid diet.

## 2021-08-29 NOTE — Assessment & Plan Note (Signed)
Colonoscopy 10/2016 - diverticulosis and internal hemorrhoids.  Follow up colonoscopy 5 years after last.

## 2021-08-29 NOTE — Assessment & Plan Note (Addendum)
Blood pressure better as outlined.  Continue lisinopril and amlodipine. Blood pressure rechecked by me 126/82.  Follow pressures.  Follow metabolic panel.

## 2021-08-29 NOTE — Assessment & Plan Note (Addendum)
-   Crestor 

## 2021-08-29 NOTE — Telephone Encounter (Signed)
She was supposed to get a copy of [facility] Lipid diet and [place] diet.  I am not sure if she received.  Can you confirm.  If not received, please mail.

## 2021-08-29 NOTE — Assessment & Plan Note (Signed)
Overall appears to be handling things well.  Follow.  ?

## 2021-08-30 NOTE — Telephone Encounter (Signed)
Pt did receive.

## 2021-09-08 ENCOUNTER — Other Ambulatory Visit (INDEPENDENT_AMBULATORY_CARE_PROVIDER_SITE_OTHER): Payer: Medicare Other

## 2021-09-08 ENCOUNTER — Other Ambulatory Visit: Payer: Self-pay

## 2021-09-08 DIAGNOSIS — I10 Essential (primary) hypertension: Secondary | ICD-10-CM

## 2021-09-08 DIAGNOSIS — E7800 Pure hypercholesterolemia, unspecified: Secondary | ICD-10-CM | POA: Diagnosis not present

## 2021-09-08 LAB — LIPID PANEL
Cholesterol: 131 mg/dL (ref 0–200)
HDL: 37.7 mg/dL — ABNORMAL LOW (ref >39.00)
LDL Cholesterol: 73 mg/dL (ref 0–99)
NonHDL: 93.28
Total CHOL/HDL Ratio: 3
Triglycerides: 101 mg/dL (ref 0.0–149.0)
VLDL: 20.2 mg/dL (ref 0.0–40.0)

## 2021-09-08 LAB — BASIC METABOLIC PANEL WITH GFR
BUN: 14 mg/dL (ref 6–23)
CO2: 31 meq/L (ref 19–32)
Calcium: 9.1 mg/dL (ref 8.4–10.5)
Chloride: 104 meq/L (ref 96–112)
Creatinine, Ser: 0.67 mg/dL (ref 0.40–1.20)
GFR: 90.22 mL/min (ref >60.00)
Glucose, Bld: 91 mg/dL (ref 70–99)
Potassium: 4.1 meq/L (ref 3.5–5.1)
Sodium: 140 meq/L (ref 135–145)

## 2021-09-08 LAB — HEPATIC FUNCTION PANEL
ALT: 19 U/L (ref 0–35)
AST: 19 U/L (ref 0–37)
Albumin: 4.5 g/dL (ref 3.5–5.2)
Alkaline Phosphatase: 45 U/L (ref 39–117)
Bilirubin, Direct: 0.1 mg/dL (ref 0.0–0.3)
Total Bilirubin: 0.4 mg/dL (ref 0.2–1.2)
Total Protein: 7.2 g/dL (ref 6.0–8.3)

## 2021-09-20 ENCOUNTER — Telehealth: Payer: Self-pay | Admitting: Internal Medicine

## 2021-09-20 NOTE — Telephone Encounter (Signed)
Patient called in waiting for referral to [facility] . Patient have not received an update please call patient @ [phone]

## 2021-09-21 NOTE — Telephone Encounter (Signed)
Advised patient she is due in march. They should contact her within the next few weeks to schedule. If not, she will call office back.

## 2021-09-22 NOTE — Telephone Encounter (Signed)
I was referring to Dr Curly Shores office reaching out to get her scheduled and putting the orders in. Are we ordering 6 month f/u views or is Dr Bary Castilla?

## 2021-09-23 NOTE — Telephone Encounter (Signed)
In reviewing the chart, it appears that Dr Bary Castilla was putting her in their recall notification, but I would recommend confirming with his office if there is a question

## 2021-09-23 NOTE — Telephone Encounter (Signed)
Confirmed with Dr Curly Shores office that they will be calling her in March and ordering f/u views

## 2021-09-27 ENCOUNTER — Other Ambulatory Visit: Payer: Self-pay | Admitting: General Surgery

## 2021-09-27 DIAGNOSIS — R928 Other abnormal and inconclusive findings on diagnostic imaging of breast: Secondary | ICD-10-CM

## 2021-10-05 ENCOUNTER — Other Ambulatory Visit: Payer: Self-pay

## 2021-10-05 ENCOUNTER — Telehealth: Payer: Self-pay | Admitting: Internal Medicine

## 2021-10-05 MED ORDER — AMLODIPINE BESYLATE 5 MG PO TABS: 5.0000 mg | ORAL_TABLET | Freq: Every day | ORAL | 1 refills | Status: DC

## 2021-10-05 NOTE — Telephone Encounter (Signed)
Rx sent in

## 2021-10-05 NOTE — Telephone Encounter (Signed)
Pt need med refill on amLODipine sent to [facility]

## 2021-10-21 ENCOUNTER — Ambulatory Visit
Admission: RE | Admit: 2021-10-21 | Discharge: 2021-10-21 | Disposition: A | Discharge status: Home / Self Care | Payer: Medicare Other | Source: Ambulatory Visit | Attending: General Surgery | Admitting: General Surgery

## 2021-10-21 ENCOUNTER — Other Ambulatory Visit: Payer: Self-pay

## 2021-10-21 DIAGNOSIS — R928 Other abnormal and inconclusive findings on diagnostic imaging of breast: Secondary | ICD-10-CM | POA: Diagnosis not present

## 2021-10-25 ENCOUNTER — Other Ambulatory Visit: Payer: Self-pay | Admitting: General Surgery

## 2021-10-25 NOTE — Progress Notes (Unsigned)
w

## 2021-10-25 NOTE — Addendum Note (Signed)
Addended by: Robert Bellow on: 10/25/2021 11:44 AM   Modules accepted: Orders

## 2021-11-18 ENCOUNTER — Other Ambulatory Visit: Payer: Self-pay

## 2021-11-18 ENCOUNTER — Telehealth: Payer: Self-pay | Admitting: Internal Medicine

## 2021-11-18 DIAGNOSIS — I10 Essential (primary) hypertension: Secondary | ICD-10-CM

## 2021-11-18 MED ORDER — LISINOPRIL 20 MG PO TABS: 20.0000 mg | ORAL_TABLET | Freq: Every day | ORAL | 1 refills | Status: DC

## 2021-11-18 NOTE — Telephone Encounter (Signed)
refilled 

## 2021-11-18 NOTE — Telephone Encounter (Signed)
Patient is requesting a refill on her lisinopril (ZESTRIL) 20 MG tablet. ?

## 2021-11-23 ENCOUNTER — Ambulatory Visit: Payer: Medicare Other | Admitting: Internal Medicine

## 2021-12-29 ENCOUNTER — Telehealth: Payer: Self-pay | Admitting: Internal Medicine

## 2021-12-29 NOTE — Telephone Encounter (Signed)
Copied from [place] [phone]. Topic: Medicare AWV ?>> Dec 29, 2021  9:35 AM Harris-Coley, Hannah Beat wrote: ?Reason for CRM: Left message for patient to schedule Annual Wellness Visit.  Please schedule with Nurse Health Advisor Denisa O'Brien-Blaney, LPN at [facility].  Please call [phone] ask for Juliann Pulse ?

## 2021-12-30 ENCOUNTER — Ambulatory Visit (INDEPENDENT_AMBULATORY_CARE_PROVIDER_SITE_OTHER): Payer: Medicare Other

## 2021-12-30 VITALS — BP 118/80 | HR 75 | Ht 63.0 in | Wt 162.0 lb

## 2021-12-30 DIAGNOSIS — Z0000 Encounter for general adult medical examination without abnormal findings: Secondary | ICD-10-CM

## 2021-12-30 NOTE — Progress Notes (Signed)
Subjective:   Brooke Gallagher is a 66 y.o. female who presents for Medicare Annual (Subsequent) preventive examination.  Review of Systems    No ROS.  Medicare Wellness Virtual Visit.  Visual/audio telehealth visit, UTA vital signs.   See social history for additional risk factors.   Cardiac Risk Factors include: advanced age (>59mn, >>71women)     Objective:    Today's Vitals   12/30/21 1236  BP: 118/80  Pulse: 75  Weight: 162 lb (73.5 kg)  Height: '5\' 3"'$  (1.6 m)   Body mass index is 28.7 kg/m.     12/30/2021   12:45 PM 12/29/2020    8:37 AM 02/26/2020   11:27 AM 01/15/2020    1:31 PM 12/27/2019    8:42 AM 03/22/2017    9:08 AM  Advanced Directives  Does Patient Have a Medical Advance Directive? Yes Yes Yes Yes Yes No  Type of AParamedicof AF[place]Living will [place]Living will H[place]Living will HK[place]Living will HSouth [place] LakeLiving will   Does patient want to make changes to medical advance directive? No - Patient declined No - Patient declined   No - Patient declined   Copy of [place]in Chart? Yes - validated most recent copy scanned in chart (See row information) Yes - validated most recent copy scanned in chart (See row information)   Yes - validated most recent copy scanned in chart (See row information)   Would patient like information on creating a medical advance directive?      Yes (MAU/Ambulatory/Procedural Areas - Information given)    Current Medications (verified) Outpatient Encounter Medications as of 12/30/2021  Medication Sig   acetaminophen (TYLENOL) 325 MG tablet Take 650 mg by mouth as needed.   amLODipine (NORVASC) 5 MG tablet Take 1 tablet (5 mg total) by mouth daily.   lisinopril (ZESTRIL) 20 MG tablet Take 1 tablet (20 mg total) by mouth daily.   loratadine (CLARITIN) 10 MG tablet Take 10 mg by mouth daily.   Melatonin 10  MG TABS Take 20 mg by mouth at bedtime as needed (sleep).    Multiple Vitamin (MULTIVITAMIN WITH MINERALS) TABS tablet Take 1 tablet by mouth daily. Women's Multivitamin   rosuvastatin (CRESTOR) 5 MG tablet Take 1 tablet (5 mg total) by mouth daily.   traZODone (DESYREL) 50 MG tablet Take 0.5-1 tablets (25-50 mg total) by mouth at bedtime as needed for sleep. (Patient not taking: Reported on 12/30/2021)   No facility-administered encounter medications on file as of 12/30/2021.    Allergies (verified) Penicillins   History: Past Medical History:  Diagnosis Date   Allergy    Colon polyp    Depression    Herpes    Hx: UTI (urinary tract infection)    Hyperlipidemia    Hypertension    Past Surgical History:  Procedure Laterality Date   ABDOMINAL HYSTERECTOMY     BACK SURGERY  1998   lower back   BREAST BIOPSY Left 04/22/2021   [place]x, / ribbon clip, BENIGN MAMMARY PARENCHYMA WITH MODERATE STROMAL FIBROSIS AND FOCAL FIBROADENOMATOID CHANGES   LAPAROSCOPIC BILATERAL SALPINGO OOPHERECTOMY Bilateral 01/22/2020   Procedure: LAPAROSCOPIC BILATERAL SALPINGO OOPHORECTOMY;  Surgeon: BMellody Drown MD;  Location: [facility] ORS;  Service: Gynecology;  Laterality: Bilateral;   OOPHORECTOMY     TONSILLECTOMY  1963   Family History  Problem Relation Age of Onset   Arthritis Mother  Stroke Mother    Hypertension Mother    Alzheimer's disease Mother    Dementia Mother    Colon cancer Father    Prostate cancer Father    Mental illness Sister    Alcohol abuse Brother    Congestive Heart Failure Brother    Cancer - Lung Brother    Colon cancer Paternal Uncle    Colon cancer Maternal Grandmother    Mental illness Maternal Grandmother    Alzheimer's disease Maternal Grandmother    Hyperlipidemia Maternal Grandfather    Hypertension Maternal Grandfather    Diabetes Maternal Grandfather    Arthritis Paternal Grandmother    Alcohol abuse Paternal Grandfather    Breast cancer Neg Hx     Social History   Socioeconomic History   Marital status: Single    Spouse name: Not on file   Number of children: Not on file   Years of education: Not on file   Highest education level: Not on file  Occupational History   Not on file  Tobacco Use   Smoking status: Never   Smokeless tobacco: Never  Vaping Use   Vaping Use: Never used  Substance and Sexual Activity   Alcohol use: Yes    Alcohol/week: 1.0 - 2.0 standard drink    Types: 1 - 2 Standard drinks or equivalent per week   Drug use: No   Sexual activity: Not Currently  Other Topics Concern   Not on file  Social History Narrative   Not on file   Social Determinants of Health   Financial Resource Strain: Low Risk    Difficulty of Paying Living Expenses: Not hard at all  Food Insecurity: No Food Insecurity   Worried About Charity fundraiser in the Last Year: Never true   [place] in the Last Year: Never true  Transportation Needs: No Transportation Needs   Lack of Transportation (Medical): No   Lack of Transportation (Non-Medical): No  Physical Activity: Sufficiently Active   Days of Exercise per Week: 5 days   Minutes of Exercise per Session: 30 min  Stress: No Stress Concern Present   Feeling of Stress : Not at all  Social Connections: Unknown   Frequency of Communication with Friends and Family: More than three times a week   Frequency of Social Gatherings with Friends and Family: More than three times a week   Attends Religious Services: More than 4 times per year   Active Member of [employer] or Organizations: Not on file   Attends Archivist Meetings: Not on file   Marital Status: Not on file    Tobacco Counseling Counseling given: Not Answered   Clinical Intake:  Pre-visit preparation completed: Yes        Diabetes: No  How often do you need to have someone help you when you read instructions, pamphlets, or other written materials from your doctor or pharmacy?: 1 -  Never    Interpreter Needed?: No      Activities of Daily Living    12/30/2021    1:05 PM  In your present state of health, do you have any difficulty performing the following activities:  Hearing? 0  Vision? 0  Difficulty concentrating or making decisions? 0  Walking or climbing stairs? 0  Dressing or bathing? 0  Doing errands, shopping? 0  Preparing Food and eating ? N  Using the Toilet? N  In the past six months, have you accidently leaked urine? N  Do  you have problems with loss of bowel control? N  Managing your Medications? N  Managing your Finances? N  Housekeeping or managing your Housekeeping? N    Patient Care Team: Einar Pheasant, MD as PCP - General (Internal Medicine) Clent Jacks, RN as Oncology Nurse Navigator  Indicate any recent Medical Services you may have received from other than [facility] providers in the past year (date may be approximate).     Assessment:   This is a routine wellness examination for Nehal.  Virtual Visit via Telephone Note  I connected with  Brooke Gallagher on 12/30/21 at 12:30 PM EDT by telephone and verified that I am speaking with the correct person using two identifiers.  Persons participating in the virtual visit: patient/Nurse Health Advisor   I discussed the limitations of performing an evaluation and management service by telehealth. We continued and completed visit with audio only. Some vital signs may be absent or patient reported.   Hearing/Vision screen Hearing Screening - Comments:: Patient is able to hear conversational tones without difficulty.  No issues reported. Vision Screening - Comments:: Followed by [facility]  Wears corrective lenses  They have seen their ophthalmologist in the last 12 months.   Dietary issues and exercise activities discussed:   Healthy diet Good water intake   Goals Addressed             This Visit's Progress    Maintain healthy lifestyle       Stay  active Healthy diet       Depression Screen    12/30/2021   12:44 PM 08/25/2021   11:56 AM 03/23/2021    8:19 AM 12/29/2020    8:39 AM 10/09/2020   11:13 AM 12/27/2019    8:42 AM 03/22/2017    9:11 AM  PHQ 2/9 Scores  PHQ - 2 Score 0 0 0 1 1 0 0  PHQ- 9 Score     5      Fall Risk    12/30/2021    1:05 PM 08/25/2021   11:56 AM 03/23/2021    8:18 AM 12/29/2020    8:38 AM 12/11/2020    7:45 AM  Fall Risk   Falls in the past year? 0 0 0 0 0  Number falls in past yr: 0 0 0 0   Injury with Fall?  0 0 0   Risk for fall due to :  Orthopedic patient     Follow up Falls evaluation completed Falls evaluation completed Falls evaluation completed Falls evaluation completed     [place]: Home free of loose throw rugs in walkways, pet beds, electrical cords, etc? Yes  Adequate lighting in your home to reduce risk of falls? Yes   ASSISTIVE DEVICES UTILIZED TO PREVENT FALLS: Life alert? No  Use of a cane, walker or w/c? No   TIMED UP AND GO: Was the test performed? No .   Cognitive Function:  Patient is alert and oriented x3.       12/27/2019    8:49 AM  6CIT Screen  What Year? 0 points  What month? 0 points  What time? 0 points  Months in reverse 0 points  Repeat phrase 0 points    Immunizations Immunization History  Administered Date(s) Administered   Fluad Quad(high Dose 65+) 05/21/2019, 07/17/2021   Influenza-Unspecified 06/01/2020   PFIZER(Purple Top)SARS-COV-2 Vaccination 09/27/2019, 10/23/2019, 06/01/2020   Pfizer Covid-19 Vaccine Bivalent Booster 53yr & up 07/03/2021  Pneumococcal Conjugate-13 06/13/2019   Pneumococcal Polysaccharide-23 08/25/2021   Tdap 03/05/2020    Shingrix Completed?: No.    Education has been provided regarding the importance of this vaccine. Patient has been advised to call insurance company to determine out of pocket expense if they have not yet received this vaccine. Advised may also receive vaccine at local  pharmacy or Health Dept. Verbalized acceptance and understanding.  Screening Tests Health Maintenance  Topic Date Due   COLONOSCOPY (Pts 45-72yr Insurance coverage will need to be confirmed)  10/13/2021   Zoster Vaccines- Shingrix (1 of 2) 04/01/2022 (Originally 11/29/2003)   MAMMOGRAM  03/31/2022   TETANUS/TDAP  03/05/2030   Pneumonia Vaccine 68 Years old  Completed   DEXA SCAN  Completed   COVID-19 Vaccine  Completed   Hepatitis C Screening  Completed   HPV VACCINES  Aged Out   INFLUENZA VACCINE  Discontinued   Health Maintenance Health Maintenance Due  Topic Date Due   COLONOSCOPY (Pts 45-458yrInsurance coverage will need to be confirmed)  10/13/2021   Colonoscopy- ordered per consent.   Lung Cancer Screening: (Low Dose CT Chest recommended if Age 68-80ears, 30 pack-year currently smoking OR have quit w/in 15years.) does not qualify.   Vision Screening: Recommended annual ophthalmology exams for early detection of glaucoma and other disorders of the eye.  Dental Screening: Recommended annual dental exams for proper oral hygiene  Community Resource Referral / Chronic Care Management: CRR required this visit?  No   CCM required this visit?  No      Plan:   Keep all routine maintenance appointments.   I have personally reviewed and noted the following in the patient's chart:   Medical and social history Use of alcohol, tobacco or illicit drugs  Current medications and supplements including opioid prescriptions.  Functional ability and status Nutritional status Physical activity Advanced directives List of other physicians Hospitalizations, surgeries, and ER visits in previous 12 months Vitals Screenings to include cognitive, depression, and falls Referrals and appointments  In addition, I have reviewed and discussed with patient certain preventive protocols, quality metrics, and best practice recommendations. A written personalized care plan for preventive  services as well as general preventive health recommendations were provided to patient.     OBVarney BilesLPN   12/21/22/2353

## 2021-12-30 NOTE — Patient Instructions (Addendum)
  Brooke Gallagher , Thank you for taking time to come for your Medicare Wellness Visit. I appreciate your ongoing commitment to your health goals. Please review the following plan we discussed and let me know if I can assist you in the future.   These are the goals we discussed:  Goals      Maintain healthy lifestyle     Stay active Healthy diet        This is a list of the screening recommended for you and due dates:  Health Maintenance  Topic Date Due   Colon Cancer Screening  10/13/2021   Zoster (Shingles) Vaccine (1 of 2) 04/01/2022*   Mammogram  03/31/2022   Tetanus Vaccine  03/05/2030   Pneumonia Vaccine  Completed   DEXA scan (bone density measurement)  Completed   COVID-19 Vaccine  Completed   Hepatitis C Screening: USPSTF Recommendation to screen - Ages 10-79 yo.  Completed   HPV Vaccine  Aged Out   Flu Shot  Discontinued  *Topic was postponed. The date shown is not the original due date.

## 2022-01-19 ENCOUNTER — Telehealth: Payer: Self-pay | Admitting: Internal Medicine

## 2022-01-19 ENCOUNTER — Ambulatory Visit
Admission: RE | Admit: 2022-01-19 | Discharge: 2022-01-19 | Disposition: A | Discharge status: Home / Self Care | Payer: Medicare Other | Source: Ambulatory Visit | Attending: Internal Medicine | Admitting: Internal Medicine

## 2022-01-19 ENCOUNTER — Ambulatory Visit (INDEPENDENT_AMBULATORY_CARE_PROVIDER_SITE_OTHER): Payer: Medicare Other | Admitting: Internal Medicine

## 2022-01-19 ENCOUNTER — Encounter: Payer: Self-pay | Admitting: Internal Medicine

## 2022-01-19 VITALS — BP 126/82 | HR 71 | Temp 98.6°F | Resp 18 | Ht 63.0 in | Wt 160.0 lb

## 2022-01-19 DIAGNOSIS — I700 Atherosclerosis of aorta: Secondary | ICD-10-CM

## 2022-01-19 DIAGNOSIS — M79661 Pain in right lower leg: Secondary | ICD-10-CM | POA: Insufficient documentation

## 2022-01-19 DIAGNOSIS — Z86010 Personal history of colonic polyps: Secondary | ICD-10-CM

## 2022-01-19 DIAGNOSIS — M79604 Pain in right leg: Secondary | ICD-10-CM | POA: Insufficient documentation

## 2022-01-19 DIAGNOSIS — M79671 Pain in right foot: Secondary | ICD-10-CM

## 2022-01-19 DIAGNOSIS — E7800 Pure hypercholesterolemia, unspecified: Secondary | ICD-10-CM

## 2022-01-19 DIAGNOSIS — I10 Essential (primary) hypertension: Secondary | ICD-10-CM | POA: Diagnosis not present

## 2022-01-19 DIAGNOSIS — R928 Other abnormal and inconclusive findings on diagnostic imaging of breast: Secondary | ICD-10-CM

## 2022-01-19 DIAGNOSIS — M1711 Unilateral primary osteoarthritis, right knee: Secondary | ICD-10-CM | POA: Insufficient documentation

## 2022-01-19 DIAGNOSIS — F439 Reaction to severe stress, unspecified: Secondary | ICD-10-CM

## 2022-01-19 LAB — CBC WITH DIFFERENTIAL/PLATELET
Basophils Absolute: 0 10*3/uL (ref 0.0–0.1)
Basophils Relative: 0.3 % (ref 0.0–3.0)
Eosinophils Absolute: 0 10*3/uL (ref 0.0–0.7)
Eosinophils Relative: 0.5 % (ref 0.0–5.0)
HCT: 39.5 % (ref 36.0–46.0)
Hemoglobin: 13.3 g/dL (ref 12.0–15.0)
Lymphocytes Relative: 17.9 % (ref 12.0–46.0)
Lymphs Abs: 1.6 10*3/uL (ref 0.7–4.0)
MCHC: 33.6 g/dL (ref 30.0–36.0)
MCV: 93.7 fl (ref 78.0–100.0)
Monocytes Absolute: 0.4 10*3/uL (ref 0.1–1.0)
Monocytes Relative: 4.7 % (ref 3.0–12.0)
Neutro Abs: 6.9 10*3/uL (ref 1.4–7.7)
Neutrophils Relative %: 76.6 % (ref 43.0–77.0)
Platelets: 281 10*3/uL (ref 150.0–400.0)
RBC: 4.22 Mil/uL (ref 3.87–5.11)
RDW: 13.2 % (ref 11.5–15.5)
WBC: 9.1 10*3/uL (ref 4.0–10.5)

## 2022-01-19 LAB — HEPATIC FUNCTION PANEL
ALT: 18 U/L (ref 0–35)
AST: 18 U/L (ref 0–37)
Albumin: 4.4 g/dL (ref 3.5–5.2)
Alkaline Phosphatase: 49 U/L (ref 39–117)
Bilirubin, Direct: 0.1 mg/dL (ref 0.0–0.3)
Total Bilirubin: 0.5 mg/dL (ref 0.2–1.2)
Total Protein: 7 g/dL (ref 6.0–8.3)

## 2022-01-19 LAB — LIPID PANEL
Cholesterol: 127 mg/dL (ref 0–200)
HDL: 35.6 mg/dL — ABNORMAL LOW (ref >39.00)
LDL Cholesterol: 65 mg/dL (ref 0–99)
NonHDL: 91.24
Total CHOL/HDL Ratio: 4
Triglycerides: 129 mg/dL (ref 0.0–149.0)
VLDL: 25.8 mg/dL (ref 0.0–40.0)

## 2022-01-19 LAB — BASIC METABOLIC PANEL
BUN: 13 mg/dL (ref 6–23)
CO2: 27 mEq/L (ref 19–32)
Calcium: 9.5 mg/dL (ref 8.4–10.5)
Chloride: 104 mEq/L (ref 96–112)
Creatinine, Ser: 0.63 mg/dL (ref 0.40–1.20)
GFR: 91.33 mL/min (ref >60.00)
Glucose, Bld: 90 mg/dL (ref 70–99)
Potassium: 4.3 mEq/L (ref 3.5–5.1)
Sodium: 140 mEq/L (ref 135–145)

## 2022-01-19 LAB — TSH: TSH: 3.25 u[IU]/mL (ref 0.35–5.50)

## 2022-01-19 NOTE — Telephone Encounter (Signed)
[facility] called with patient's ultra sound results were negative for DVT. Results are in [place].

## 2022-01-19 NOTE — Progress Notes (Signed)
Patient ID: Brooke Gallagher, female   DOB: 26-Jun-1956, 66 y.o.   MRN: [id number]   Subjective:    Patient ID: Brooke Gallagher, female    DOB: May 30, 1956, 66 y.o.   MRN: [id number]   Patient here for a scheduled follow up.   Chief Complaint  Patient presents with   Follow-up   Hypertension   Hyperlipidemia   .   HPI Noticed over the last couple of days increased right calf and popliteal pain.  Hurts to walk.  Hurts to bend - pain localized to calf/popliteal region with these motions.  No known injury or trauma.  No redness.  No chest pain or sob reported.  No abdominal pain or bowel change reported.  Outside blood pressures reviewed.  Recently blood pressures averaging 116-120s/70-80.  Does report some right heel pain - described as a burning sensation - notices at night.  Does not bother during the day.  Persistent.  Has tried supports.  Has tied positioning - with weight off the heel when sleeping.  Not helping.     Past Medical History:  Diagnosis Date   Allergy    Colon polyp    Depression    Herpes    Hx: UTI (urinary tract infection)    Hyperlipidemia    Hypertension    Past Surgical History:  Procedure Laterality Date   ABDOMINAL HYSTERECTOMY     BACK SURGERY  1998   lower back   BREAST BIOPSY Left 04/22/2021   [place] Bx, / ribbon clip, BENIGN MAMMARY PARENCHYMA WITH MODERATE STROMAL FIBROSIS AND FOCAL FIBROADENOMATOID CHANGES   LAPAROSCOPIC BILATERAL SALPINGO OOPHERECTOMY Bilateral 01/22/2020   Procedure: LAPAROSCOPIC BILATERAL SALPINGO OOPHORECTOMY;  Surgeon: Mellody Drown, MD;  Location: [facility] ORS;  Service: Gynecology;  Laterality: Bilateral;   OOPHORECTOMY     TONSILLECTOMY  1963   Family History  Problem Relation Age of Onset   Arthritis Mother    Stroke Mother    Hypertension Mother    Alzheimer's disease Mother    Dementia Mother    Colon cancer Father    Prostate cancer Father    Mental illness Sister    Alcohol abuse Brother    Congestive Heart  Failure Brother    Cancer - Lung Brother    Colon cancer Paternal Uncle    Colon cancer Maternal Grandmother    Mental illness Maternal Grandmother    Alzheimer's disease Maternal Grandmother    Hyperlipidemia Maternal Grandfather    Hypertension Maternal Grandfather    Diabetes Maternal Grandfather    Arthritis Paternal Grandmother    Alcohol abuse Paternal Grandfather    Breast cancer Neg Hx    Social History   Socioeconomic History   Marital status: Single    Spouse name: Not on file   Number of children: Not on file   Years of education: Not on file   Highest education level: Not on file  Occupational History   Not on file  Tobacco Use   Smoking status: Never   Smokeless tobacco: Never  Vaping Use   Vaping Use: Never used  Substance and Sexual Activity   Alcohol use: Yes    Alcohol/week: 1.0 - 2.0 standard drink of alcohol    Types: 1 - 2 Standard drinks or equivalent per week   Drug use: No   Sexual activity: Not Currently  Other Topics Concern   Not on file  Social History Narrative   Not on file   Social Determinants of Health  Financial Resource Strain: Low Risk  (12/30/2021)   Overall Financial Resource Strain (CARDIA)    Difficulty of Paying Living Expenses: Not hard at all  Food Insecurity: No Food Insecurity (12/30/2021)   Hunger Vital Sign    Worried About Running Out of Food in the Last Year: Never true    Ran Out of Food in the Last Year: Never true  Transportation Needs: No Transportation Needs (12/30/2021)   PRAPARE - Hydrologist (Medical): No    Lack of Transportation (Non-Medical): No  Physical Activity: Sufficiently Active (12/30/2021)   Exercise Vital Sign    Days of Exercise per Week: 5 days    Minutes of Exercise per Session: 30 min  Stress: No Stress Concern Present (12/30/2021)   [place]    Feeling of Stress : Not at all  Social Connections:  Unknown (12/30/2021)   Social Connection and Isolation Panel [NHANES]    Frequency of Communication with Friends and Family: More than three times a week    Frequency of Social Gatherings with Friends and Family: More than three times a week    Attends Religious Services: More than 4 times per year    Active Member of [employer] or Organizations: Not on file    Attends Archivist Meetings: Not on file    Marital Status: Not on file     Review of Systems  Constitutional:  Negative for appetite change and unexpected weight change.  HENT:  Negative for congestion and sinus pressure.   Respiratory:  Negative for cough, chest tightness and shortness of breath.   Cardiovascular:  Negative for chest pain, palpitations and leg swelling.  Gastrointestinal:  Negative for abdominal pain, diarrhea, nausea and vomiting.  Genitourinary:  Negative for difficulty urinating and dysuria.  Musculoskeletal:  Negative for joint swelling.       Leg and heel pain as outlined.   Skin:  Negative for color change and rash.  Neurological:  Negative for dizziness, light-headedness and headaches.  Psychiatric/Behavioral:  Negative for agitation and dysphoric mood.        Objective:     BP 126/82 (BP Location: Left Arm, Patient Position: Sitting, Cuff Size: Small)   Pulse 71   Temp 98.6 F (37 C) (Temporal)   Resp 18   Ht '5\' 3"'$  (1.6 m)   Wt 160 lb (72.6 kg)   SpO2 98%   BMI 28.34 kg/m  Wt Readings from Last 3 Encounters:  01/19/22 160 lb (72.6 kg)  12/30/21 162 lb (73.5 kg)  08/25/21 162 lb 12.8 oz (73.8 kg)    Physical Exam Vitals reviewed.  Constitutional:      General: She is not in acute distress.    Appearance: Normal appearance.  HENT:     Head: Normocephalic and atraumatic.     Right Ear: External ear normal.     Left Ear: External ear normal.  Eyes:     General: No scleral icterus.       Right eye: No discharge.        Left eye: No discharge.     Conjunctiva/sclera:  Conjunctivae normal.  Neck:     Thyroid: No thyromegaly.  Cardiovascular:     Rate and Rhythm: Normal rate and regular rhythm.  Pulmonary:     Effort: No respiratory distress.     Breath sounds: Normal breath sounds. No wheezing.  Abdominal:     General: Bowel sounds are  normal.     Palpations: Abdomen is soft.     Tenderness: There is no abdominal tenderness.  Musculoskeletal:        General: No swelling.     Cervical back: Neck supple. No tenderness.     Comments: Increased pain with palpation - right calf/popliteal region and ankle.  Varicose veins present.  No increased erythema.  No pain over the veins.  Increased pain with flexion at knee.   Lymphadenopathy:     Cervical: No cervical adenopathy.  Skin:    Findings: No erythema or rash.  Neurological:     Mental Status: She is alert.  Psychiatric:        Mood and Affect: Mood normal.        Behavior: Behavior normal.      Outpatient Encounter Medications as of 01/19/2022  Medication Sig   acetaminophen (TYLENOL) 325 MG tablet Take 650 mg by mouth as needed.   amLODipine (NORVASC) 5 MG tablet Take 1 tablet (5 mg total) by mouth daily.   lisinopril (ZESTRIL) 20 MG tablet Take 1 tablet (20 mg total) by mouth daily.   loratadine (CLARITIN) 10 MG tablet Take 10 mg by mouth daily.   Melatonin 10 MG TABS Take 20 mg by mouth at bedtime as needed (sleep).    Multiple Vitamin (MULTIVITAMIN WITH MINERALS) TABS tablet Take 1 tablet by mouth daily. Women's Multivitamin   rosuvastatin (CRESTOR) 5 MG tablet Take 1 tablet (5 mg total) by mouth daily.   [DISCONTINUED] traZODone (DESYREL) 50 MG tablet Take 0.5-1 tablets (25-50 mg total) by mouth at bedtime as needed for sleep. (Patient not taking: Reported on 01/19/2022)   No facility-administered encounter medications on file as of 01/19/2022.     Lab Results  Component Value Date   WBC 9.1 01/19/2022   HGB 13.3 01/19/2022   HCT 39.5 01/19/2022   PLT 281.0 01/19/2022   GLUCOSE 90  01/19/2022   CHOL 127 01/19/2022   TRIG 129.0 01/19/2022   HDL 35.60 (L) 01/19/2022   LDLDIRECT 146.7 05/03/2013   LDLCALC 65 01/19/2022   ALT 18 01/19/2022   AST 18 01/19/2022   NA 140 01/19/2022   K 4.3 01/19/2022   CL 104 01/19/2022   CREATININE 0.63 01/19/2022   BUN 13 01/19/2022   CO2 27 01/19/2022   TSH 3.25 01/19/2022    MM DIAG BREAST TOMO UNI LEFT  Result Date: 10/21/2021 CLINICAL DATA:  Short-term follow-up left breast asymmetry EXAM: DIGITAL DIAGNOSTIC UNILATERAL LEFT MAMMOGRAM WITH TOMOSYNTHESIS AND CAD TECHNIQUE: Left digital diagnostic mammography and breast tomosynthesis was performed. The images were evaluated with computer-aided detection. COMPARISON:  Prior films ACR Breast Density Category b: There are scattered areas of fibroglandular density. FINDINGS: Cc and MLO views of the left breast are submitted. Previously noted asymmetry in the Peri areolar lateral left breast is unchanged. IMPRESSION: Probable benign findings. RECOMMENDATION: Bilateral diagnostic mammogram in August of 2023. I have discussed the findings and recommendations with the patient. If applicable, a reminder letter will be sent to the patient regarding the next appointment. BI-RADS CATEGORY  3: Probably benign. Electronically Signed   By: Abelardo Diesel M.D.   On: 10/21/2021 14:52      Assessment & Plan:   Problem List Items Addressed This Visit     Abnormal mammogram    The patient had an abnormal mammogram at the end of August. Subsequent ultrasound-guided core biopsy showed benign breast tissue and postbiopsy imaging showed the clip about 2 cm away  from the index lesion on her original screening and diagnostic studies, which after biopsy was no longer evident. A 77-monthfollow-up diagnostic mammogram has been recommended by the attending radiologist.  Was seeing Dr BBary Castilla F/u mammogram 10/21/2021 - Birads III.  Recommended f/u bilateral diagnostic mammogram in 03/2022.       Aortic atherosclerosis  (HCC)    Low cholesterol diet and exercise.  Consider statin treatment.       Essential hypertension, benign - Primary    Blood pressure better as outlined.  Continue lisinopril and amlodipine. Follow pressures.  Follow metabolic panel.        Relevant Orders   Basic Metabolic Panel (BMET) (Completed)   History of colonic polyps    Colonoscopy 10/2016 - diverticulosis and internal hemorrhoids.  Follow up colonoscopy 5 years after last. Needs referral to GI.       Hypercholesterolemia    Low cholesterol diet and exercise.  Continue crestor.  Follow lipid panel and liver function tests.       Relevant Orders   Lipid Profile (Completed)   Hepatic function panel (Completed)   CBC with Differential/Platelet (Completed)   TSH (Completed)   Pain of right heel   Relevant Orders   Ambulatory referral to Podiatry   Right leg pain    Increased pain as outlined.  No swelling or redness.  No pain over the varicose veins.  Increased pain with flexion of knee and with weight bearing.  Pain localized to right popliteal, right calf and right ankle - with palpation.  Will check right lower extremity ultrasound.  (confirm no clot, Baker's cyst, etc).  If ultrasound negative, will have ortho evaluate.       Relevant Orders   [place]Venous Img Lower Unilateral Right (Completed)   Stress    Overall appears to be handling things well.  Follow.          CEinar Pheasant MD

## 2022-01-20 ENCOUNTER — Encounter: Payer: Self-pay | Admitting: Internal Medicine

## 2022-01-20 DIAGNOSIS — M79671 Pain in right foot: Secondary | ICD-10-CM | POA: Insufficient documentation

## 2022-01-20 NOTE — Assessment & Plan Note (Addendum)
The patient had an abnormal mammogram at the end of August. Subsequent ultrasound-guided core biopsy showed benign breast tissue and postbiopsy imaging showed the clip about 2 cm away from the index lesion on her original screening and diagnostic studies, which after biopsy was no longer evident. A 16-monthfollow-up diagnostic mammogram has been recommended by the attending radiologist.  Was seeing Dr BBary Castilla F/u mammogram 10/21/2021 - Birads III.  Recommended f/u bilateral diagnostic mammogram in 03/2022.

## 2022-01-20 NOTE — Assessment & Plan Note (Signed)
Blood pressure better as outlined.  Continue lisinopril and amlodipine. Follow pressures.  Follow metabolic panel.   

## 2022-01-20 NOTE — Assessment & Plan Note (Signed)
Increased pain as outlined.  No swelling or redness.  No pain over the varicose veins.  Increased pain with flexion of knee and with weight bearing.  Pain localized to right popliteal, right calf and right ankle - with palpation.  Will check right lower extremity ultrasound.  (confirm no clot, Baker's cyst, etc).  If ultrasound negative, will have ortho evaluate.

## 2022-01-20 NOTE — Assessment & Plan Note (Signed)
Low cholesterol diet and exercise.  Continue crestor.  Follow lipid panel and liver function tests.   

## 2022-01-20 NOTE — Assessment & Plan Note (Signed)
Low cholesterol diet and exercise.  Consider statin treatment.

## 2022-01-20 NOTE — Assessment & Plan Note (Addendum)
Colonoscopy 10/2016 - diverticulosis and internal hemorrhoids.  Follow up colonoscopy 5 years after last. Needs referral to GI.

## 2022-01-20 NOTE — Assessment & Plan Note (Signed)
Overall appears to be handling things well.  Follow.  ?

## 2022-01-21 ENCOUNTER — Other Ambulatory Visit: Payer: Self-pay | Admitting: Internal Medicine

## 2022-01-21 DIAGNOSIS — Z1211 Encounter for screening for malignant neoplasm of colon: Secondary | ICD-10-CM

## 2022-01-21 NOTE — Progress Notes (Signed)
Order placed for GI referral.   

## 2022-02-07 ENCOUNTER — Other Ambulatory Visit: Payer: Self-pay | Admitting: Internal Medicine

## 2022-03-23 ENCOUNTER — Other Ambulatory Visit: Payer: Self-pay | Admitting: Internal Medicine

## 2022-04-05 ENCOUNTER — Other Ambulatory Visit: Payer: Self-pay | Admitting: Internal Medicine

## 2022-04-05 DIAGNOSIS — R928 Other abnormal and inconclusive findings on diagnostic imaging of breast: Secondary | ICD-10-CM

## 2022-04-05 DIAGNOSIS — N6489 Other specified disorders of breast: Secondary | ICD-10-CM

## 2022-04-28 ENCOUNTER — Other Ambulatory Visit: Payer: Self-pay | Admitting: *Deleted

## 2022-04-28 ENCOUNTER — Ambulatory Visit
Admission: RE | Admit: 2022-04-28 | Discharge: 2022-04-28 | Disposition: A | Discharge status: Home / Self Care | Payer: Medicare Other | Source: Ambulatory Visit | Attending: Internal Medicine | Admitting: Internal Medicine

## 2022-04-28 DIAGNOSIS — N6489 Other specified disorders of breast: Secondary | ICD-10-CM | POA: Insufficient documentation

## 2022-04-28 DIAGNOSIS — R928 Other abnormal and inconclusive findings on diagnostic imaging of breast: Secondary | ICD-10-CM | POA: Diagnosis present

## 2022-04-29 ENCOUNTER — Encounter: Payer: Self-pay | Admitting: Internal Medicine

## 2022-04-29 ENCOUNTER — Ambulatory Visit (INDEPENDENT_AMBULATORY_CARE_PROVIDER_SITE_OTHER): Payer: Medicare Other | Admitting: Internal Medicine

## 2022-04-29 VITALS — BP 122/80 | HR 72 | Temp 98.2°F | Resp 17 | Ht 62.0 in | Wt 158.5 lb

## 2022-04-29 DIAGNOSIS — Z23 Encounter for immunization: Secondary | ICD-10-CM

## 2022-04-29 DIAGNOSIS — Z86010 Personal history of colonic polyps: Secondary | ICD-10-CM

## 2022-04-29 DIAGNOSIS — I700 Atherosclerosis of aorta: Secondary | ICD-10-CM

## 2022-04-29 DIAGNOSIS — E7800 Pure hypercholesterolemia, unspecified: Secondary | ICD-10-CM | POA: Diagnosis not present

## 2022-04-29 DIAGNOSIS — I10 Essential (primary) hypertension: Secondary | ICD-10-CM | POA: Diagnosis not present

## 2022-04-29 DIAGNOSIS — Z0000 Encounter for general adult medical examination without abnormal findings: Secondary | ICD-10-CM

## 2022-04-29 DIAGNOSIS — R351 Nocturia: Secondary | ICD-10-CM

## 2022-04-29 LAB — LIPID PANEL
Cholesterol: 113 mg/dL (ref 0–200)
HDL: 29.5 mg/dL — ABNORMAL LOW (ref >39.00)
LDL Cholesterol: 58 mg/dL (ref 0–99)
NonHDL: 83.51
Total CHOL/HDL Ratio: 4
Triglycerides: 129 mg/dL (ref 0.0–149.0)
VLDL: 25.8 mg/dL (ref 0.0–40.0)

## 2022-04-29 LAB — HEPATIC FUNCTION PANEL
ALT: 15 U/L (ref 0–35)
AST: 17 U/L (ref 0–37)
Albumin: 4.2 g/dL (ref 3.5–5.2)
Alkaline Phosphatase: 48 U/L (ref 39–117)
Bilirubin, Direct: 0.1 mg/dL (ref 0.0–0.3)
Total Bilirubin: 0.5 mg/dL (ref 0.2–1.2)
Total Protein: 7 g/dL (ref 6.0–8.3)

## 2022-04-29 LAB — BASIC METABOLIC PANEL
BUN: 13 mg/dL (ref 6–23)
CO2: 29 mEq/L (ref 19–32)
Calcium: 9.5 mg/dL (ref 8.4–10.5)
Chloride: 102 mEq/L (ref 96–112)
Creatinine, Ser: 0.73 mg/dL (ref 0.40–1.20)
GFR: 84.5 mL/min (ref >60.00)
Glucose, Bld: 88 mg/dL (ref 70–99)
Potassium: 4.1 mEq/L (ref 3.5–5.1)
Sodium: 139 mEq/L (ref 135–145)

## 2022-04-29 NOTE — Assessment & Plan Note (Signed)
Physical today 04/29/22.  Colonoscopy 03/2017 - recommended f/u in 5 years. Has appt with GI 06/2022.  PAP 05/2019 - negative with negative HPV. Mammogram (bilateral diagnostic) - 04/28/22 - Birads III.  Recommended f/u bilateral diagnostic mammogram in one year.

## 2022-04-29 NOTE — Progress Notes (Unsigned)
Patient ID: Saralynn Langhorst, female   DOB: 1956-05-12, 66 y.o.   MRN: [id number]   Subjective:    Patient ID: Dalya Maselli, female    DOB: Sep 20, 1955, 66 y.o.   MRN: [id number]   Patient here for her physical.   Chief Complaint  Patient presents with   Annual Exam    CPE   .   HPI Here for her physical exam.    Past Medical History:  Diagnosis Date   Allergy    Colon polyp    Depression    Herpes    Hx: UTI (urinary tract infection)    Hyperlipidemia    Hypertension    Past Surgical History:  Procedure Laterality Date   ABDOMINAL HYSTERECTOMY     BACK SURGERY  1998   lower back   BREAST BIOPSY Left 04/22/2021   [place] Bx, / ribbon clip, BENIGN MAMMARY PARENCHYMA WITH MODERATE STROMAL FIBROSIS AND FOCAL FIBROADENOMATOID CHANGES   LAPAROSCOPIC BILATERAL SALPINGO OOPHERECTOMY Bilateral 01/22/2020   Procedure: LAPAROSCOPIC BILATERAL SALPINGO OOPHORECTOMY;  Surgeon: Mellody Drown, MD;  Location: [facility] ORS;  Service: Gynecology;  Laterality: Bilateral;   OOPHORECTOMY     TONSILLECTOMY  1963   Family History  Problem Relation Age of Onset   Arthritis Mother    Stroke Mother    Hypertension Mother    Alzheimer's disease Mother    Dementia Mother    Colon cancer Father    Prostate cancer Father    Mental illness Sister    Alcohol abuse Brother    Congestive Heart Failure Brother    Cancer - Lung Brother    Colon cancer Paternal Uncle    Colon cancer Maternal Grandmother    Mental illness Maternal Grandmother    Alzheimer's disease Maternal Grandmother    Hyperlipidemia Maternal Grandfather    Hypertension Maternal Grandfather    Diabetes Maternal Grandfather    Arthritis Paternal Grandmother    Alcohol abuse Paternal Grandfather    Breast cancer Neg Hx    Social History   Socioeconomic History   Marital status: Single    Spouse name: Not on file   Number of children: Not on file   Years of education: Not on file   Highest education level: Not on file   Occupational History   Not on file  Tobacco Use   Smoking status: Never   Smokeless tobacco: Never  Vaping Use   Vaping Use: Never used  Substance and Sexual Activity   Alcohol use: Yes    Alcohol/week: 1.0 - 2.0 standard drink of alcohol    Types: 1 - 2 Standard drinks or equivalent per week   Drug use: No   Sexual activity: Not Currently  Other Topics Concern   Not on file  Social History Narrative   Not on file   Social Determinants of Health   Financial Resource Strain: Low Risk  (12/30/2021)   Overall Financial Resource Strain (CARDIA)    Difficulty of Paying Living Expenses: Not hard at all  Food Insecurity: No Food Insecurity (12/30/2021)   Hunger Vital Sign    Worried About Running Out of Food in the Last Year: Never true    Ran Out of Food in the Last Year: Never true  Transportation Needs: No Transportation Needs (12/30/2021)   PRAPARE - Hydrologist (Medical): No    Lack of Transportation (Non-Medical): No  Physical Activity: Sufficiently Active (12/30/2021)   Exercise Vital Sign    Days  of Exercise per Week: 5 days    Minutes of Exercise per Session: 30 min  Stress: No Stress Concern Present (12/30/2021)   [place]    Feeling of Stress : Not at all  Social Connections: Unknown (12/30/2021)   Social Connection and Isolation Panel [NHANES]    Frequency of Communication with Friends and Family: More than three times a week    Frequency of Social Gatherings with Friends and Family: More than three times a week    Attends Religious Services: More than 4 times per year    Active Member of [employer] or Organizations: Not on file    Attends Music therapist: Not on file    Marital Status: Not on file     Review of Systems     Objective:     BP (!) 120/90   Pulse 72   Temp 98.2 F (36.8 C) (Oral)   Resp 17   Ht '5\' 2"'$  (1.575 m)   Wt 158 lb 8 oz (71.9 kg)    SpO2 99%   BMI 28.99 kg/m  Wt Readings from Last 3 Encounters:  04/29/22 158 lb 8 oz (71.9 kg)  01/19/22 160 lb (72.6 kg)  12/30/21 162 lb (73.5 kg)    Physical Exam   Outpatient Encounter Medications as of 04/29/2022  Medication Sig   acetaminophen (TYLENOL) 325 MG tablet Take 650 mg by mouth as needed.   amLODipine (NORVASC) 5 MG tablet Take 1 tablet by mouth once daily   lisinopril (ZESTRIL) 20 MG tablet Take 1 tablet (20 mg total) by mouth daily.   loratadine (CLARITIN) 10 MG tablet Take 10 mg by mouth daily.   Melatonin 10 MG TABS Take 20 mg by mouth at bedtime as needed (sleep).    Multiple Vitamin (MULTIVITAMIN WITH MINERALS) TABS tablet Take 1 tablet by mouth daily. Women's Multivitamin   rosuvastatin (CRESTOR) 5 MG tablet Take 1 tablet by mouth once daily   No facility-administered encounter medications on file as of 04/29/2022.     Lab Results  Component Value Date   WBC 9.1 01/19/2022   HGB 13.3 01/19/2022   HCT 39.5 01/19/2022   PLT 281.0 01/19/2022   GLUCOSE 90 01/19/2022   CHOL 127 01/19/2022   TRIG 129.0 01/19/2022   HDL 35.60 (L) 01/19/2022   LDLDIRECT 146.7 05/03/2013   LDLCALC 65 01/19/2022   ALT 18 01/19/2022   AST 18 01/19/2022   NA 140 01/19/2022   K 4.3 01/19/2022   CL 104 01/19/2022   CREATININE 0.63 01/19/2022   BUN 13 01/19/2022   CO2 27 01/19/2022   TSH 3.25 01/19/2022    MM DIAG BREAST TOMO BILATERAL  Result Date: 04/28/2022 CLINICAL DATA:  Short-term follow-up for probably benign left breast asymmetry. EXAM: DIGITAL DIAGNOSTIC BILATERAL MAMMOGRAM WITH TOMOSYNTHESIS TECHNIQUE: Bilateral digital diagnostic mammography and breast tomosynthesis was performed. COMPARISON:  Previous exam(s). ACR Breast Density Category b: There are scattered areas of fibroglandular density. FINDINGS: No suspicious masses or calcifications are seen in either breast. Small focal asymmetry in the outer periareolar left breast is unchanged. There is no mammographic  evidence of malignancy in either breast. IMPRESSION: Stable probably benign left breast asymmetry. No mammographic evidence of malignancy in either breast. RECOMMENDATION: Recommend bilateral diagnostic mammography in 1 year which will demonstrate a total of 2 years of stability of the probably benign left breast asymmetry. I have discussed the findings and recommendations with the patient. If applicable,  a reminder letter will be sent to the patient regarding the next appointment. BI-RADS CATEGORY  3: Probably benign. Electronically Signed   By: Everlean Alstrom M.D.   On: 04/28/2022 09:54      Assessment & Plan:   Problem List Items Addressed This Visit     Essential hypertension, benign   Relevant Orders   Basic Metabolic Panel (BMET)   Healthcare maintenance    Physical today 04/29/22.  Colonoscopy 03/2017 - recommended f/u in 5 years.  PAP 05/2019 - negative with negative HPV. Mammogram (bilateral diagnostic) - 04/28/22 - Birads III.  Recommended f/u bilateral diagnostic mammogram in one year.       Hypercholesterolemia - Primary   Relevant Orders   Hepatic function panel   Lipid panel   Other Visit Diagnoses     Need for immunization against influenza       Relevant Orders   Flu Vaccine QUAD High Dose(Fluad) (Completed)        Einar Pheasant, MD

## 2022-04-30 ENCOUNTER — Encounter: Payer: Self-pay | Admitting: Internal Medicine

## 2022-04-30 DIAGNOSIS — R351 Nocturia: Secondary | ICD-10-CM | POA: Insufficient documentation

## 2022-04-30 NOTE — Assessment & Plan Note (Signed)
Colonoscopy 10/2016 - diverticulosis and internal hemorrhoids.  Follow up colonoscopy 5 years after last. Has appt with GI 06/2022.

## 2022-04-30 NOTE — Assessment & Plan Note (Addendum)
Low cholesterol diet and exercise.  Consider crestor.  

## 2022-04-30 NOTE — Assessment & Plan Note (Signed)
Urine leakage and nocturia as outlined.  Discussed treatment options and further w/up, including referral to urology, medication and pelvic floor PT.  Desires to monitor for now.  Also discussed evaluation for sleep apnea.  Wants to monitor.  Will notify me if desires further evaluation/work up.

## 2022-04-30 NOTE — Assessment & Plan Note (Signed)
Blood pressure better as outlined.  Continue lisinopril and amlodipine. Follow pressures.  Follow metabolic panel.   

## 2022-04-30 NOTE — Assessment & Plan Note (Signed)
Low cholesterol diet and exercise.  Continue crestor.  Follow lipid panel and liver function tests.   

## 2022-05-17 ENCOUNTER — Other Ambulatory Visit: Payer: Self-pay

## 2022-05-17 ENCOUNTER — Telehealth: Payer: Self-pay | Admitting: Internal Medicine

## 2022-05-17 DIAGNOSIS — I10 Essential (primary) hypertension: Secondary | ICD-10-CM

## 2022-05-17 MED ORDER — LISINOPRIL 20 MG PO TABS: 20.0000 mg | ORAL_TABLET | Freq: Every day | ORAL | 1 refills | Status: DC

## 2022-05-17 MED ORDER — ROSUVASTATIN CALCIUM 5 MG PO TABS: 5.0000 mg | ORAL_TABLET | Freq: Every day | ORAL | 0 refills | Status: DC

## 2022-05-17 NOTE — Telephone Encounter (Signed)
Patient needs the following refills; lisinopril (ZESTRIL) 20 MG tablet and  rosuvastatin (CRESTOR) 5 MG tablet.

## 2022-05-17 NOTE — Telephone Encounter (Signed)
Pt notified that prescriptions sent.

## 2022-05-27 IMAGING — MG MM DIGITAL SCREENING BILAT W/ TOMO AND CAD
8 series · 8 of 24 positions shown · non-contrast
Comparison: Previous exam(s).

CLINICAL DATA: Screening.

EXAM:
DIGITAL SCREENING BILATERAL MAMMOGRAM WITH TOMOSYNTHESIS AND CAD
TECHNIQUE: Bilateral screening digital craniocaudal and mediolateral oblique
mammograms were obtained. Bilateral screening digital breast
tomosynthesis was performed. The images were evaluated with
computer-aided detection.

[L MLO synth-2D]
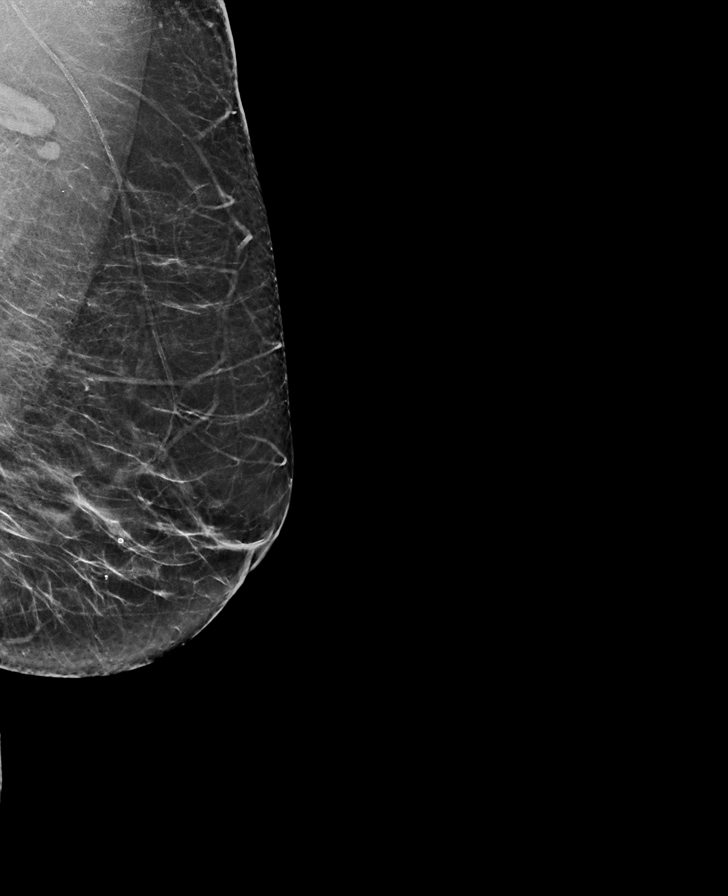

[R MLO synth-2D]
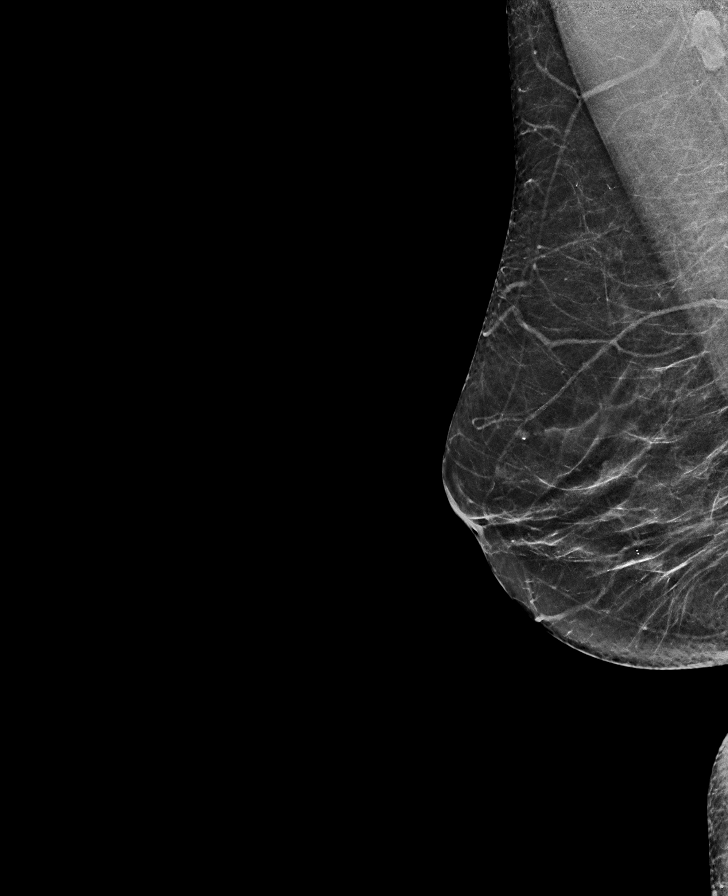

[L CC synth-2D]
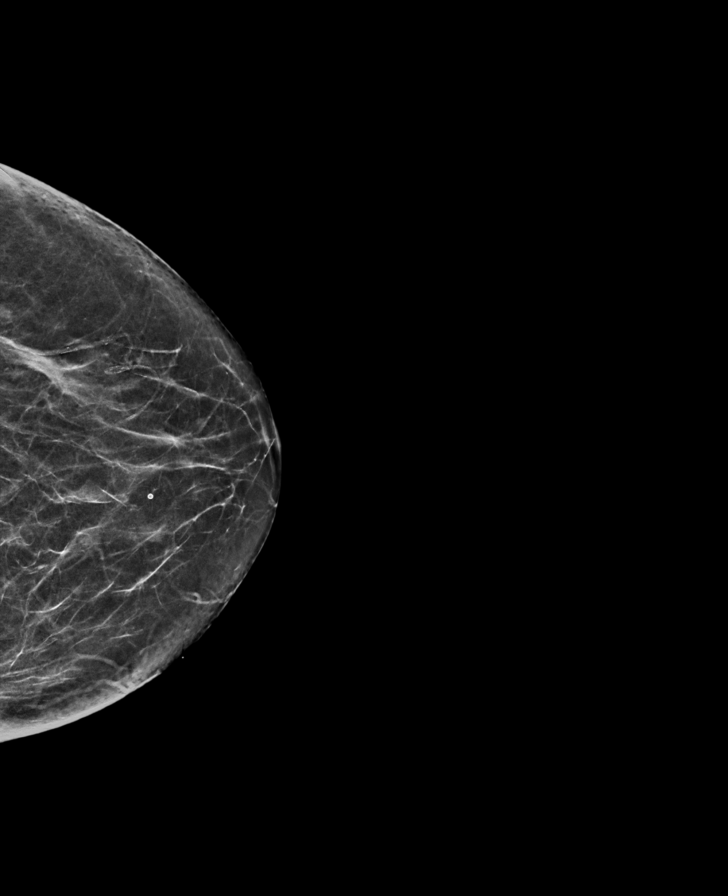

[R CC synth-2D]
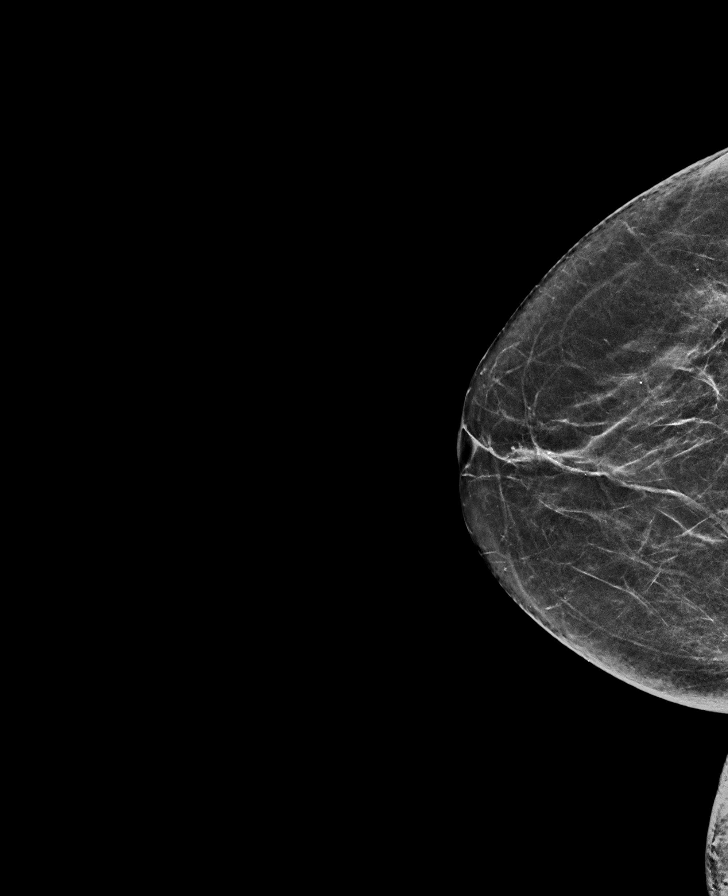

[R MLO tomo · tomo slice 30/59.0]
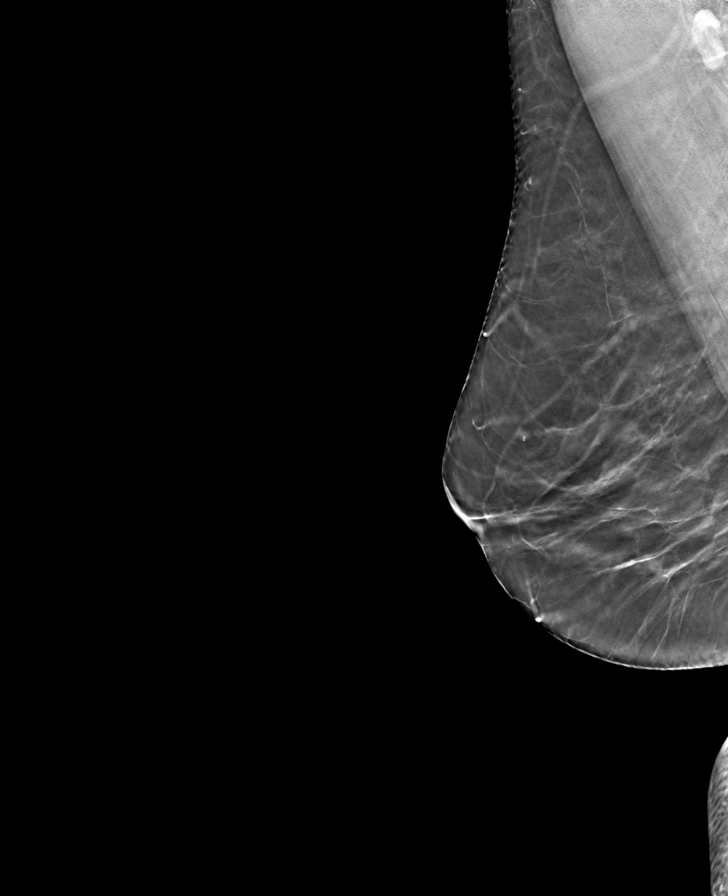

[L CC tomo · tomo slice 29/56.0]
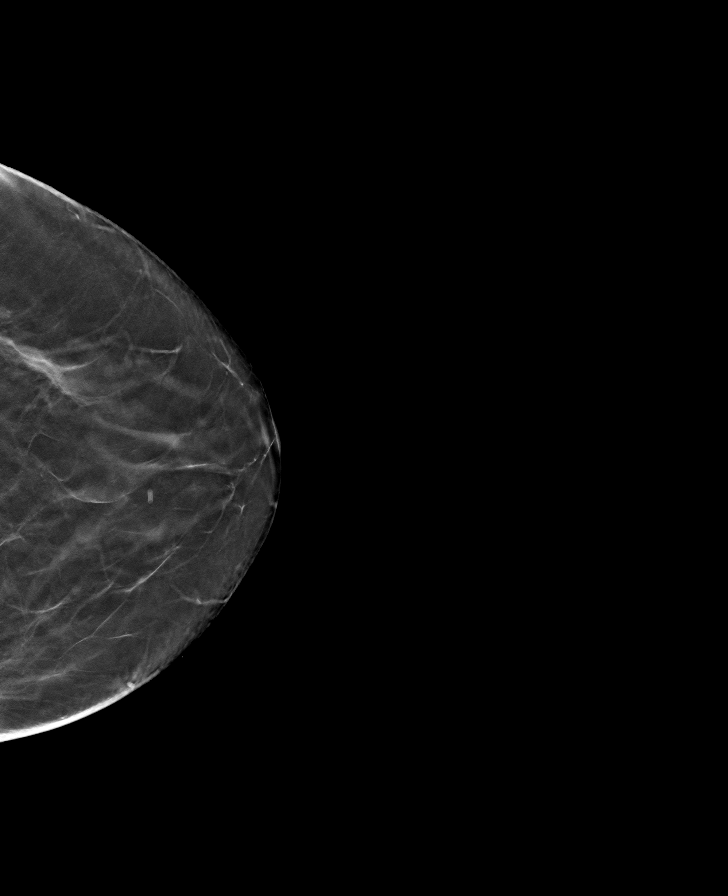

[R CC tomo · tomo slice 28/55.0]
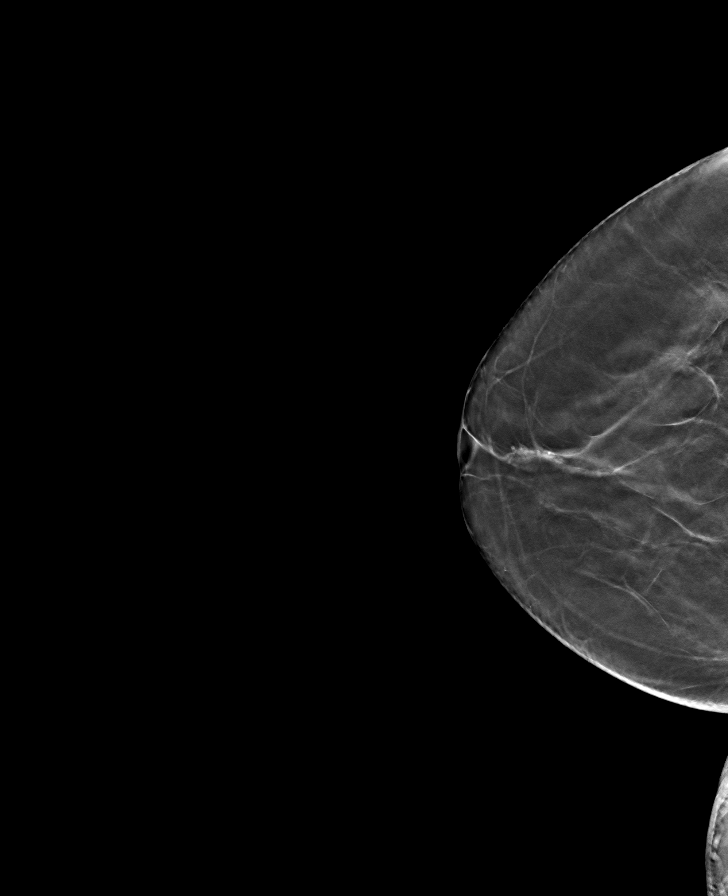

[L MLO tomo · tomo slice 33/64.0]
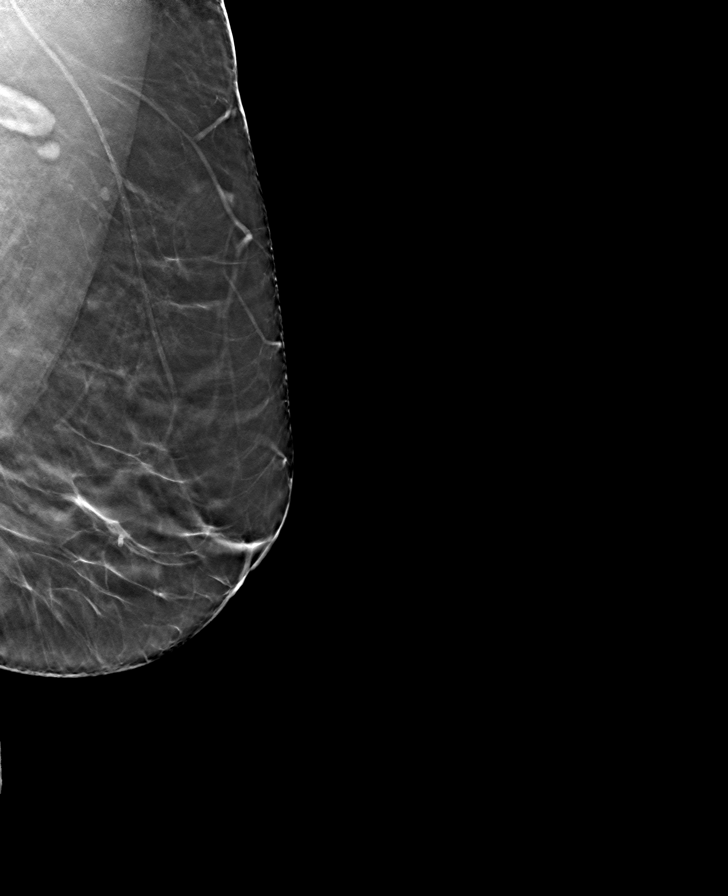

[8 of 24 positions shown; findings below may reference images not displayed]

ACR Breast Density Category b: There are scattered areas of
fibroglandular density.
FINDINGS: In the left breast, a possible mass warrants further evaluation. In
the right breast, no findings suspicious for malignancy.
IMPRESSION: Further evaluation is suggested for a possible mass in the left
breast.

RECOMMENDATION:
Diagnostic mammogram and possibly ultrasound of the left breast.
(Code:[id number]-X-44M)

The patient will be contacted regarding the findings, and additional
imaging will be scheduled.

BI-RADS CATEGORY  0: Incomplete. Need additional imaging evaluation
and/or prior mammograms for comparison.

## 2022-07-28 LAB — HM COLONOSCOPY

## 2022-08-10 ENCOUNTER — Other Ambulatory Visit: Payer: Self-pay | Admitting: Internal Medicine

## 2022-08-31 ENCOUNTER — Encounter: Payer: Self-pay | Admitting: Internal Medicine

## 2022-08-31 ENCOUNTER — Ambulatory Visit (INDEPENDENT_AMBULATORY_CARE_PROVIDER_SITE_OTHER): Payer: Medicare Other | Admitting: Internal Medicine

## 2022-08-31 VITALS — BP 128/72 | HR 69 | Temp 97.6°F | Resp 16 | Ht 62.0 in | Wt 155.8 lb

## 2022-08-31 DIAGNOSIS — E7800 Pure hypercholesterolemia, unspecified: Secondary | ICD-10-CM | POA: Diagnosis not present

## 2022-08-31 DIAGNOSIS — I700 Atherosclerosis of aorta: Secondary | ICD-10-CM

## 2022-08-31 DIAGNOSIS — I10 Essential (primary) hypertension: Secondary | ICD-10-CM | POA: Diagnosis not present

## 2022-08-31 DIAGNOSIS — Z86010 Personal history of colonic polyps: Secondary | ICD-10-CM | POA: Diagnosis not present

## 2022-08-31 DIAGNOSIS — G629 Polyneuropathy, unspecified: Secondary | ICD-10-CM | POA: Diagnosis not present

## 2022-08-31 DIAGNOSIS — F439 Reaction to severe stress, unspecified: Secondary | ICD-10-CM

## 2022-08-31 LAB — BASIC METABOLIC PANEL
BUN: 15 mg/dL (ref 6–23)
CO2: 29 mEq/L (ref 19–32)
Calcium: 9.4 mg/dL (ref 8.4–10.5)
Chloride: 104 mEq/L (ref 96–112)
Creatinine, Ser: 0.71 mg/dL (ref 0.40–1.20)
GFR: 87.16 mL/min (ref >60.00)
Glucose, Bld: 94 mg/dL (ref 70–99)
Potassium: 3.7 mEq/L (ref 3.5–5.1)
Sodium: 142 mEq/L (ref 135–145)

## 2022-08-31 LAB — CBC WITH DIFFERENTIAL/PLATELET
Basophils Absolute: 0 10*3/uL (ref 0.0–0.1)
Basophils Relative: 0.5 % (ref 0.0–3.0)
Eosinophils Absolute: 0.1 10*3/uL (ref 0.0–0.7)
Eosinophils Relative: 1 % (ref 0.0–5.0)
HCT: 39.8 % (ref 36.0–46.0)
Hemoglobin: 13.5 g/dL (ref 12.0–15.0)
Lymphocytes Relative: 34.4 % (ref 12.0–46.0)
Lymphs Abs: 1.9 10*3/uL (ref 0.7–4.0)
MCHC: 34 g/dL (ref 30.0–36.0)
MCV: 92.7 fl (ref 78.0–100.0)
Monocytes Absolute: 0.3 10*3/uL (ref 0.1–1.0)
Monocytes Relative: 5.3 % (ref 3.0–12.0)
Neutro Abs: 3.3 10*3/uL (ref 1.4–7.7)
Neutrophils Relative %: 58.8 % (ref 43.0–77.0)
Platelets: 289 10*3/uL (ref 150.0–400.0)
RBC: 4.29 Mil/uL (ref 3.87–5.11)
RDW: 13.9 % (ref 11.5–15.5)
WBC: 5.5 10*3/uL (ref 4.0–10.5)

## 2022-08-31 LAB — LIPID PANEL
Cholesterol: 123 mg/dL (ref 0–200)
HDL: 33.2 mg/dL — ABNORMAL LOW (ref >39.00)
LDL Cholesterol: 66 mg/dL (ref 0–99)
NonHDL: 89.82
Total CHOL/HDL Ratio: 4
Triglycerides: 118 mg/dL (ref 0.0–149.0)
VLDL: 23.6 mg/dL (ref 0.0–40.0)

## 2022-08-31 LAB — HEPATIC FUNCTION PANEL
ALT: 15 U/L (ref 0–35)
AST: 17 U/L (ref 0–37)
Albumin: 4.4 g/dL (ref 3.5–5.2)
Alkaline Phosphatase: 45 U/L (ref 39–117)
Bilirubin, Direct: 0.2 mg/dL (ref 0.0–0.3)
Total Bilirubin: 0.5 mg/dL (ref 0.2–1.2)
Total Protein: 7.4 g/dL (ref 6.0–8.3)

## 2022-08-31 LAB — VITAMIN B12: Vitamin B-12: 397 pg/mL (ref 211–911)

## 2022-08-31 MED ORDER — LISINOPRIL 20 MG PO TABS: 20.0000 mg | ORAL_TABLET | Freq: Every day | ORAL | 1 refills | Status: DC

## 2022-08-31 MED ORDER — AMLODIPINE BESYLATE 5 MG PO TABS: 5.0000 mg | ORAL_TABLET | Freq: Every day | ORAL | 1 refills | Status: DC

## 2022-08-31 MED ORDER — ROSUVASTATIN CALCIUM 5 MG PO TABS: 5.0000 mg | ORAL_TABLET | Freq: Every day | ORAL | 3 refills | Status: DC

## 2022-08-31 NOTE — Assessment & Plan Note (Signed)
Blood pressure better as outlined.  Continue lisinopril and amlodipine. Follow pressures.  Follow metabolic panel.

## 2022-08-31 NOTE — Progress Notes (Signed)
Subjective:    Patient ID: Brooke Gallagher, female    DOB: [birth year], 67 y.o.   MRN: [id number]  Patient here for  Chief Complaint  Patient presents with   Medical Management of Chronic Issues   Hyperlipidemia   Hypertension    HPI Here to follow up regarding hypercholesterolemia and hypertension.  Continues on lisinopril and amlodipine.  Last visit, was having problems with urine leakage and nocturia.  Discussed further evaluation, including evaluation for sleep apnea. Discussed with her today.  Urine not a significant issue for her now.  Sleep she does not feel is an issue. GI - colonoscopy (Dr Haig Prophet) - 07/28/22 - diverticulosis and internal hemorrhoids.  Breathing stable.  No increased cough  or congestion. No abdominal pain.  Bowels moving.  Pain - top of feet - shooting pain.  Worse at night. More frequent last two weeks.  Also pain intermittently shoots up to her knees.    Past Medical History:  Diagnosis Date   Allergy    Colon polyp    Depression    Herpes    Hx: UTI (urinary tract infection)    Hyperlipidemia    Hypertension    Past Surgical History:  Procedure Laterality Date   ABDOMINAL HYSTERECTOMY     BACK SURGERY  1998   lower back   BREAST BIOPSY Left 04/22/2021   [place] Bx, / ribbon clip, BENIGN MAMMARY PARENCHYMA WITH MODERATE STROMAL FIBROSIS AND FOCAL FIBROADENOMATOID CHANGES   LAPAROSCOPIC BILATERAL SALPINGO OOPHERECTOMY Bilateral 01/22/2020   Procedure: LAPAROSCOPIC BILATERAL SALPINGO OOPHORECTOMY;  Surgeon: Mellody Drown, MD;  Location: [facility] ORS;  Service: Gynecology;  Laterality: Bilateral;   OOPHORECTOMY     TONSILLECTOMY  1963   Family History  Problem Relation Age of Onset   Arthritis Mother    Stroke Mother    Hypertension Mother    Alzheimer's disease Mother    Dementia Mother    Colon cancer Father    Prostate cancer Father    Mental illness Sister    Alcohol abuse Brother    Congestive Heart Failure Brother    Cancer - Lung Brother     Colon cancer Paternal Uncle    Colon cancer Maternal Grandmother    Mental illness Maternal Grandmother    Alzheimer's disease Maternal Grandmother    Hyperlipidemia Maternal Grandfather    Hypertension Maternal Grandfather    Diabetes Maternal Grandfather    Arthritis Paternal Grandmother    Alcohol abuse Paternal Grandfather    Breast cancer Neg Hx    Social History   Socioeconomic History   Marital status: Single    Spouse name: Not on file   Number of children: Not on file   Years of education: Not on file   Highest education level: Not on file  Occupational History   Not on file  Tobacco Use   Smoking status: Never   Smokeless tobacco: Never  Vaping Use   Vaping Use: Never used  Substance and Sexual Activity   Alcohol use: Yes    Alcohol/week: 1.0 - 2.0 standard drink of alcohol    Types: 1 - 2 Standard drinks or equivalent per week   Drug use: No   Sexual activity: Not Currently  Other Topics Concern   Not on file  Social History Narrative   Not on file   Social Determinants of Health   Financial Resource Strain: Low Risk  (12/30/2021)   Overall Financial Resource Strain (CARDIA)    Difficulty of Paying Living  Expenses: Not hard at all  Food Insecurity: No Food Insecurity (12/30/2021)   Hunger Vital Sign    Worried About Running Out of Food in the Last Year: Never true    Ran Out of Food in the Last Year: Never true  Transportation Needs: No Transportation Needs (12/30/2021)   PRAPARE - Hydrologist (Medical): No    Lack of Transportation (Non-Medical): No  Physical Activity: Sufficiently Active (12/30/2021)   Exercise Vital Sign    Days of Exercise per Week: 5 days    Minutes of Exercise per Session: 30 min  Stress: No Stress Concern Present (12/30/2021)   [place]    Feeling of Stress : Not at all  Social Connections: Unknown (12/30/2021)   Social Connection  and Isolation Panel [NHANES]    Frequency of Communication with Friends and Family: More than three times a week    Frequency of Social Gatherings with Friends and Family: More than three times a week    Attends Religious Services: More than 4 times per year    Active Member of [employer] or Organizations: Not on file    Attends Archivist Meetings: Not on file    Marital Status: Not on file     Review of Systems  Constitutional:  Negative for appetite change and unexpected weight change.  HENT:  Negative for congestion and sinus pressure.   Respiratory:  Negative for cough, chest tightness and shortness of breath.   Cardiovascular:  Negative for chest pain, palpitations and leg swelling.  Gastrointestinal:  Negative for abdominal pain, diarrhea, nausea and vomiting.  Genitourinary:  Negative for difficulty urinating and dysuria.  Musculoskeletal:  Negative for joint swelling and myalgias.       Pain - feet as outlined.   Skin:  Negative for color change and rash.  Neurological:  Negative for dizziness and headaches.  Psychiatric/Behavioral:  Negative for agitation and dysphoric mood.        Objective:     BP 128/72 (BP Location: Left Arm, Patient Position: Sitting, Cuff Size: Normal)   Pulse 69   Temp 97.6 F (36.4 C)   Resp 16   Ht '5\' 2"'$  (1.575 m)   Wt 155 lb 12.8 oz (70.7 kg)   SpO2 99%   BMI 28.50 kg/m  Wt Readings from Last 3 Encounters:  08/31/22 155 lb 12.8 oz (70.7 kg)  04/29/22 158 lb 8 oz (71.9 kg)  01/19/22 160 lb (72.6 kg)    Physical Exam Vitals reviewed.  Constitutional:      General: She is not in acute distress.    Appearance: Normal appearance.  HENT:     Head: Normocephalic and atraumatic.     Right Ear: External ear normal.     Left Ear: External ear normal.  Eyes:     General: No scleral icterus.       Right eye: No discharge.        Left eye: No discharge.     Conjunctiva/sclera: Conjunctivae normal.  Neck:     Thyroid: No  thyromegaly.  Cardiovascular:     Rate and Rhythm: Normal rate and regular rhythm.  Pulmonary:     Effort: No respiratory distress.     Breath sounds: Normal breath sounds. No wheezing.  Abdominal:     General: Bowel sounds are normal.     Palpations: Abdomen is soft.     Tenderness: There is no abdominal  tenderness.  Musculoskeletal:        General: No swelling or tenderness.     Cervical back: Neck supple. No tenderness.  Lymphadenopathy:     Cervical: No cervical adenopathy.  Skin:    Findings: No erythema or rash.  Neurological:     Mental Status: She is alert.     Comments: Decreased sensation to pin prick - feet - up to ankles.   Psychiatric:        Mood and Affect: Mood normal.        Behavior: Behavior normal.      Outpatient Encounter Medications as of 08/31/2022  Medication Sig   acetaminophen (TYLENOL) 325 MG tablet Take 650 mg by mouth as needed.   amLODipine (NORVASC) 5 MG tablet Take 1 tablet (5 mg total) by mouth daily.   lisinopril (ZESTRIL) 20 MG tablet Take 1 tablet (20 mg total) by mouth daily.   loratadine (CLARITIN) 10 MG tablet Take 10 mg by mouth daily.   Melatonin 10 MG TABS Take 20 mg by mouth at bedtime as needed (sleep).    Multiple Vitamin (MULTIVITAMIN WITH MINERALS) TABS tablet Take 1 tablet by mouth daily. Women's Multivitamin   rosuvastatin (CRESTOR) 5 MG tablet Take 1 tablet (5 mg total) by mouth daily.   [DISCONTINUED] amLODipine (NORVASC) 5 MG tablet Take 1 tablet by mouth once daily   [DISCONTINUED] lisinopril (ZESTRIL) 20 MG tablet Take 1 tablet (20 mg total) by mouth daily.   [DISCONTINUED] rosuvastatin (CRESTOR) 5 MG tablet Take 1 tablet by mouth once daily   No facility-administered encounter medications on file as of 08/31/2022.     Lab Results  Component Value Date   WBC 5.5 08/31/2022   HGB 13.5 08/31/2022   HCT 39.8 08/31/2022   PLT 289.0 08/31/2022   GLUCOSE 94 08/31/2022   CHOL 123 08/31/2022   TRIG 118.0 08/31/2022   HDL  33.20 (L) 08/31/2022   LDLDIRECT 146.7 05/03/2013   LDLCALC 66 08/31/2022   ALT 15 08/31/2022   AST 17 08/31/2022   NA 142 08/31/2022   K 3.7 08/31/2022   CL 104 08/31/2022   CREATININE 0.71 08/31/2022   BUN 15 08/31/2022   CO2 29 08/31/2022   TSH 3.25 01/19/2022    MM DIAG BREAST TOMO BILATERAL  Result Date: 04/28/2022 CLINICAL DATA:  Short-term follow-up for probably benign left breast asymmetry. EXAM: DIGITAL DIAGNOSTIC BILATERAL MAMMOGRAM WITH TOMOSYNTHESIS TECHNIQUE: Bilateral digital diagnostic mammography and breast tomosynthesis was performed. COMPARISON:  Previous exam(s). ACR Breast Density Category b: There are scattered areas of fibroglandular density. FINDINGS: No suspicious masses or calcifications are seen in either breast. Small focal asymmetry in the outer periareolar left breast is unchanged. There is no mammographic evidence of malignancy in either breast. IMPRESSION: Stable probably benign left breast asymmetry. No mammographic evidence of malignancy in either breast. RECOMMENDATION: Recommend bilateral diagnostic mammography in 1 year which will demonstrate a total of 2 years of stability of the probably benign left breast asymmetry. I have discussed the findings and recommendations with the patient. If applicable, a reminder letter will be sent to the patient regarding the next appointment. BI-RADS CATEGORY  3: Probably benign. Electronically Signed   By: Everlean Alstrom M.D.   On: 04/28/2022 09:54      Assessment & Plan:  Hypercholesterolemia Assessment & Plan: Low cholesterol diet and exercise.  Continue crestor.  Follow lipid panel and liver function tests.   Orders: -     Hepatic function panel -  Lipid panel -     CBC with Differential/Platelet  Essential hypertension, benign Assessment & Plan: Blood pressure better as outlined.  Continue lisinopril and amlodipine. Follow pressures.  Follow metabolic panel.    Orders: -     Basic metabolic  panel  History of colonic polyps Assessment & Plan: Colonoscopy 07/28/22 - Dr Haig Prophet - diverticulosis and internal hemorrhoids.  Recommended f/u colonoscopy in 2-3 years - bowel prep suboptimal.     Neuropathy Assessment & Plan: Decreased sensation on exam as outlined.  Described the shooting pain - both feet.  Check routine labs, including B12.  Discussed possible NCS to further evaluate.   Orders: -     Vitamin B12  Aortic atherosclerosis ([place]) Assessment & Plan: Low cholesterol diet and exercise.  Consider crestor.    Stress Assessment & Plan: Overall appears to be handling things well.  Follow.     Other orders -     amLODIPine Besylate; Take 1 tablet (5 mg total) by mouth daily.  Dispense: 90 tablet; Refill: 1 -     Lisinopril; Take 1 tablet (20 mg total) by mouth daily.  Dispense: 90 tablet; Refill: 1 -     Rosuvastatin Calcium; Take 1 tablet (5 mg total) by mouth daily.  Dispense: 90 tablet; Refill: 3     Einar Pheasant, MD

## 2022-08-31 NOTE — Assessment & Plan Note (Signed)
Low cholesterol diet and exercise.  Continue crestor.  Follow lipid panel and liver function tests.

## 2022-08-31 NOTE — Assessment & Plan Note (Signed)
Colonoscopy 07/28/22 - Dr Haig Prophet - diverticulosis and internal hemorrhoids.  Recommended f/u colonoscopy in 2-3 years - bowel prep suboptimal.

## 2022-09-02 ENCOUNTER — Encounter: Payer: Self-pay | Admitting: Internal Medicine

## 2022-09-02 NOTE — Assessment & Plan Note (Signed)
Decreased sensation on exam as outlined.  Described the shooting pain - both feet.  Check routine labs, including B12.  Discussed possible NCS to further evaluate.

## 2022-09-02 NOTE — Assessment & Plan Note (Signed)
Overall appears to be handling things well.  Follow.  ?

## 2022-09-02 NOTE — Assessment & Plan Note (Signed)
Low cholesterol diet and exercise.  Consider crestor.

## 2022-09-30 ENCOUNTER — Ambulatory Visit (INDEPENDENT_AMBULATORY_CARE_PROVIDER_SITE_OTHER): Payer: Medicare Other | Admitting: Nurse Practitioner

## 2022-09-30 ENCOUNTER — Encounter: Payer: Self-pay | Admitting: Nurse Practitioner

## 2022-09-30 ENCOUNTER — Ambulatory Visit (INDEPENDENT_AMBULATORY_CARE_PROVIDER_SITE_OTHER): Payer: Medicare Other

## 2022-09-30 VITALS — BP 140/80 | HR 81 | Temp 98.1°F | Ht 62.0 in | Wt 155.0 lb

## 2022-09-30 DIAGNOSIS — M7989 Other specified soft tissue disorders: Secondary | ICD-10-CM

## 2022-09-30 NOTE — Patient Instructions (Addendum)
We will do the X ray of right foot today. Continue Tylenol, alternate heat and cold pack. Keep the feet elevated and do not put pressure on the foot. Will call with the results from the x-ray.

## 2022-09-30 NOTE — Progress Notes (Signed)
Established Patient Office Visit  Subjective:  Patient ID: Brooke Gallagher, female    DOB: 1956-04-22  Age: 67 y.o. MRN: [id number]  CC:  Chief Complaint  Patient presents with   Acute Visit    Rt foot pain X 3 days     HPI  Brooke Gallagher presents for right foot swelling and pain from last 3 days. She is using a cane to ambulate.   She felt sharp pain while she was walking 3 days ago and the next day she had swelling and pain of the dorsum aspect of foot including the ankle.  Denies any injury, twisting or fall. She trired ice pack and tylenol with some improvement.   HPI   Past Medical History:  Diagnosis Date   Allergy    Colon polyp    Depression    Herpes    Hx: UTI (urinary tract infection)    Hyperlipidemia    Hypertension     Past Surgical History:  Procedure Laterality Date   ABDOMINAL HYSTERECTOMY     BACK SURGERY  1998   lower back   BREAST BIOPSY Left 04/22/2021   [place] Bx, / ribbon clip, BENIGN MAMMARY PARENCHYMA WITH MODERATE STROMAL FIBROSIS AND FOCAL FIBROADENOMATOID CHANGES   LAPAROSCOPIC BILATERAL SALPINGO OOPHERECTOMY Bilateral 01/22/2020   Procedure: LAPAROSCOPIC BILATERAL SALPINGO OOPHORECTOMY;  Surgeon: Leida Lauth, MD;  Location: [facility] ORS;  Service: Gynecology;  Laterality: Bilateral;   OOPHORECTOMY     TONSILLECTOMY  1963    Family History  Problem Relation Age of Onset   Arthritis Mother    Stroke Mother    Hypertension Mother    Alzheimer's disease Mother    Dementia Mother    Colon cancer Father    Prostate cancer Father    Mental illness Sister    Alcohol abuse Brother    Congestive Heart Failure Brother    Cancer - Lung Brother    Colon cancer Paternal Uncle    Colon cancer Maternal Grandmother    Mental illness Maternal Grandmother    Alzheimer's disease Maternal Grandmother    Hyperlipidemia Maternal Grandfather    Hypertension Maternal Grandfather    Diabetes Maternal Grandfather    Arthritis Paternal Grandmother     Alcohol abuse Paternal Grandfather    Breast cancer Neg Hx     Social History   Socioeconomic History   Marital status: Single    Spouse name: Not on file   Number of children: Not on file   Years of education: Not on file   Highest education level: Not on file  Occupational History   Not on file  Tobacco Use   Smoking status: Never   Smokeless tobacco: Never  Vaping Use   Vaping Use: Never used  Substance and Sexual Activity   Alcohol use: Yes    Alcohol/week: 1.0 - 2.0 standard drink of alcohol    Types: 1 - 2 Standard drinks or equivalent per week   Drug use: No   Sexual activity: Not Currently  Other Topics Concern   Not on file  Social History Narrative   Not on file   Social Determinants of Health   Financial Resource Strain: Low Risk  (12/30/2021)   Overall Financial Resource Strain (CARDIA)    Difficulty of Paying Living Expenses: Not hard at all  Food Insecurity: No Food Insecurity (12/30/2021)   Hunger Vital Sign    Worried About Running Out of Food in the Last Year: Never true  Ran Out of Food in the Last Year: Never true  Transportation Needs: No Transportation Needs (12/30/2021)   PRAPARE - Administrator, Civil Service (Medical): No    Lack of Transportation (Non-Medical): No  Physical Activity: Sufficiently Active (12/30/2021)   Exercise Vital Sign    Days of Exercise per Week: 5 days    Minutes of Exercise per Session: 30 min  Stress: No Stress Concern Present (12/30/2021)   [employer] of Occupational Health - Occupational Stress Questionnaire    Feeling of Stress : Not at all  Social Connections: Unknown (12/30/2021)   Social Connection and Isolation Panel [NHANES]    Frequency of Communication with Friends and Family: More than three times a week    Frequency of Social Gatherings with Friends and Family: More than three times a week    Attends Religious Services: More than 4 times per year    Active Member of [employer] or  Organizations: Not on file    Attends Banker Meetings: Not on file    Marital Status: Not on file  Intimate Partner Violence: Not At Risk (12/30/2021)   Humiliation, Afraid, Rape, and Kick questionnaire    Fear of Current or Ex-Partner: No    Emotionally Abused: No    Physically Abused: No    Sexually Abused: No     Outpatient Medications Prior to Visit  Medication Sig Dispense Refill   acetaminophen (TYLENOL) 325 MG tablet Take 650 mg by mouth as needed.     amLODipine (NORVASC) 5 MG tablet Take 1 tablet (5 mg total) by mouth daily. 90 tablet 1   lisinopril (ZESTRIL) 20 MG tablet Take 1 tablet (20 mg total) by mouth daily. 90 tablet 1   loratadine (CLARITIN) 10 MG tablet Take 10 mg by mouth daily.     Melatonin 10 MG TABS Take 20 mg by mouth at bedtime as needed (sleep).      Multiple Vitamin (MULTIVITAMIN WITH MINERALS) TABS tablet Take 1 tablet by mouth daily. Women's Multivitamin     rosuvastatin (CRESTOR) 5 MG tablet Take 1 tablet (5 mg total) by mouth daily. 90 tablet 3   No facility-administered medications prior to visit.    Allergies  Allergen Reactions   Penicillins Rash    ROS Review of Systems  Constitutional: Negative.   HENT: Negative.    Respiratory:  Negative for cough and chest tightness.   Cardiovascular:  Negative for chest pain and palpitations.  Musculoskeletal:        Foot pain  Neurological: Negative.   Psychiatric/Behavioral: Negative.        Objective:    Physical Exam Vitals reviewed: ambulating with crutches.  Constitutional:      Appearance: Normal appearance.  Cardiovascular:     Rate and Rhythm: Normal rate and regular rhythm.     Pulses: Normal pulses.     Heart sounds: Normal heart sounds.  Pulmonary:     Effort: Pulmonary effort is normal.     Breath sounds: Normal breath sounds.  Musculoskeletal:       Feet:  Neurological:     Mental Status: She is alert.  Psychiatric:        Behavior: Behavior is  cooperative.     BP (!) 140/80   Pulse 81   Temp 98.1 F (36.7 C) (Oral)   Ht 5\' 2"  (1.575 m)   Wt 155 lb (70.3 kg)   SpO2 99%   BMI 28.35 kg/m  Wt Readings from  Last 3 Encounters:  09/30/22 155 lb (70.3 kg)  08/31/22 155 lb 12.8 oz (70.7 kg)  04/29/22 158 lb 8 oz (71.9 kg)     Health Maintenance  Topic Date Due   COVID-19 Vaccine (5 - 2023-24 season) 10/16/2022 (Originally 04/15/2022)   Zoster Vaccines- Shingrix (1 of 2) 11/30/2022 (Originally 11/29/2003)   Medicare Annual Wellness (AWV)  12/31/2022   MAMMOGRAM  04/28/2024   COLONOSCOPY (Pts 45-24yrs Insurance coverage will need to be confirmed)  07/28/2024   DTaP/Tdap/Td (2 - Td or Tdap) 03/05/2030   Pneumonia Vaccine 64+ Years old  Completed   DEXA SCAN  Completed   Hepatitis C Screening  Completed   HPV VACCINES  Aged Out   INFLUENZA VACCINE  Discontinued    There are no preventive care reminders to display for this patient.  Lab Results  Component Value Date   TSH 3.25 01/19/2022   Lab Results  Component Value Date   WBC 5.5 08/31/2022   HGB 13.5 08/31/2022   HCT 39.8 08/31/2022   MCV 92.7 08/31/2022   PLT 289.0 08/31/2022   Lab Results  Component Value Date   NA 142 08/31/2022   K 3.7 08/31/2022   CO2 29 08/31/2022   GLUCOSE 94 08/31/2022   BUN 15 08/31/2022   CREATININE 0.71 08/31/2022   BILITOT 0.5 08/31/2022   ALKPHOS 45 08/31/2022   AST 17 08/31/2022   ALT 15 08/31/2022   PROT 7.4 08/31/2022   ALBUMIN 4.4 08/31/2022   CALCIUM 9.4 08/31/2022   ANIONGAP 7 01/15/2020   GFR 87.16 08/31/2022   Lab Results  Component Value Date   CHOL 123 08/31/2022   Lab Results  Component Value Date   HDL 33.20 (L) 08/31/2022   Lab Results  Component Value Date   LDLCALC 66 08/31/2022   Lab Results  Component Value Date   TRIG 118.0 08/31/2022   Lab Results  Component Value Date   CHOLHDL 4 08/31/2022   No results found for: "HGBA1C"    Assessment & Plan:   Problem List Items Addressed  This Visit       Other   Swelling of right foot - Primary    X ray of the foot ordered. Advised to keep the foot elevated, use compression alternate ice and heat. She refused to use splint or boot at present.      Relevant Orders   DG Foot Complete Right     No orders of the defined types were placed in this encounter.      Follow-up: Return if symptoms worsen or fail to improve.    Kara Dies, NP

## 2022-10-03 ENCOUNTER — Encounter: Payer: Self-pay | Admitting: Nurse Practitioner

## 2022-10-03 DIAGNOSIS — M7989 Other specified soft tissue disorders: Secondary | ICD-10-CM | POA: Insufficient documentation

## 2022-10-03 NOTE — Assessment & Plan Note (Signed)
X ray of the foot ordered. Advised to keep the foot elevated, use compression alternate ice and heat. She refused to use splint or boot at present.

## 2022-10-06 NOTE — Progress Notes (Signed)
X rays of the foot shows no fracture.  How is swelling of foot now?

## 2022-10-06 NOTE — Progress Notes (Signed)
Noted  

## 2023-01-16 ENCOUNTER — Ambulatory Visit (INDEPENDENT_AMBULATORY_CARE_PROVIDER_SITE_OTHER): Payer: Medicare Other

## 2023-01-16 VITALS — Wt 155.0 lb

## 2023-01-16 DIAGNOSIS — Z0000 Encounter for general adult medical examination without abnormal findings: Secondary | ICD-10-CM | POA: Diagnosis not present

## 2023-01-16 NOTE — Patient Instructions (Signed)
Brooke Gallagher , Thank you for taking time to come for your Medicare Wellness Visit. I appreciate your ongoing commitment to your health goals. Please review the following plan we discussed and let me know if I can assist you in the future.   These are the goals we discussed:  Goals      DIET - EAT MORE FRUITS AND VEGETABLES     Maintain healthy lifestyle     Stay active Healthy diet        This is a list of the screening recommended for you and due dates:  Health Maintenance  Topic Date Due   Zoster (Shingles) Vaccine (1 of 2) Never done   COVID-19 Vaccine (5 - 2023-24 season) 04/15/2022   Mammogram  04/29/2023   Medicare Annual Wellness Visit  01/16/2024   Colon Cancer Screening  07/28/2024   DTaP/Tdap/Td vaccine (2 - Td or Tdap) 03/05/2030   Pneumonia Vaccine  Completed   DEXA scan (bone density measurement)  Completed   Hepatitis C Screening  Completed   HPV Vaccine  Aged Out   Flu Shot  Discontinued    Advanced directives: yes  Conditions/risks identified: none  Next appointment: Follow up in one year for your annual wellness visit 01/17/24 @ 8:15 am by phone   Preventive Care 65 Years and Older, Female Preventive care refers to lifestyle choices and visits with your health care provider that can promote health and wellness. What does preventive care include? A yearly physical exam. This is also called an annual well check. Dental exams once or twice a year. Routine eye exams. Ask your health care provider how often you should have your eyes checked. Personal lifestyle choices, including: Daily care of your teeth and gums. Regular physical activity. Eating a healthy diet. Avoiding tobacco and drug use. Limiting alcohol use. Practicing safe sex. Taking low-dose aspirin every day. Taking vitamin and mineral supplements as recommended by your health care provider. What happens during an annual well check? The services and screenings done by your health care provider  during your annual well check will depend on your age, overall health, lifestyle risk factors, and family history of disease. Counseling  Your health care provider may ask you questions about your: Alcohol use. Tobacco use. Drug use. Emotional well-being. Home and relationship well-being. Sexual activity. Eating habits. History of falls. Memory and ability to understand (cognition). Work and work Astronomer. Reproductive health. Screening  You may have the following tests or measurements: Height, weight, and BMI. Blood pressure. Lipid and cholesterol levels. These may be checked every 5 years, or more frequently if you are over 33 years old. Skin check. Lung cancer screening. You may have this screening every year starting at age 3 if you have a 30-pack-year history of smoking and currently smoke or have quit within the past 15 years. Fecal occult blood test (FOBT) of the stool. You may have this test every year starting at age 61. Flexible sigmoidoscopy or colonoscopy. You may have a sigmoidoscopy every 5 years or a colonoscopy every 10 years starting at age 35. Hepatitis C blood test. Hepatitis B blood test. Sexually transmitted disease (STD) testing. Diabetes screening. This is done by checking your blood sugar (glucose) after you have not eaten for a while (fasting). You may have this done every 1-3 years. Bone density scan. This is done to screen for osteoporosis. You may have this done starting at age 45. Mammogram. This may be done every 1-2 years. Talk to your health  care provider about how often you should have regular mammograms. Talk with your health care provider about your test results, treatment options, and if necessary, the need for more tests. Vaccines  Your health care provider may recommend certain vaccines, such as: Influenza vaccine. This is recommended every year. Tetanus, diphtheria, and acellular pertussis (Tdap, Td) vaccine. You may need a Td booster every  10 years. Zoster vaccine. You may need this after age 52. Pneumococcal 13-valent conjugate (PCV13) vaccine. One dose is recommended after age 5. Pneumococcal polysaccharide (PPSV23) vaccine. One dose is recommended after age 42. Talk to your health care provider about which screenings and vaccines you need and how often you need them. This information is not intended to replace advice given to you by your health care provider. Make sure you discuss any questions you have with your health care provider. Document Released: 08/28/2015 Document Revised: 04/20/2016 Document Reviewed: 06/02/2015 Elsevier Interactive Patient Education  2017 [employer].  Fall Prevention in the Home Falls can cause injuries. They can happen to people of all ages. There are many things you can do to make your home safe and to help prevent falls. What can I do on the outside of my home? Regularly fix the edges of walkways and driveways and fix any cracks. Remove anything that might make you trip as you walk through a door, such as a raised step or threshold. Trim any bushes or trees on the path to your home. Use bright outdoor lighting. Clear any walking paths of anything that might make someone trip, such as rocks or tools. Regularly check to see if handrails are loose or broken. Make sure that both sides of any steps have handrails. Any raised decks and porches should have guardrails on the edges. Have any leaves, snow, or ice cleared regularly. Use sand or salt on walking paths during winter. Clean up any spills in your garage right away. This includes oil or grease spills. What can I do in the bathroom? Use night lights. Install grab bars by the toilet and in the tub and shower. Do not use towel bars as grab bars. Use non-skid mats or decals in the tub or shower. If you need to sit down in the shower, use a plastic, non-slip stool. Keep the floor dry. Clean up any water that spills on the floor as soon as it  happens. Remove soap buildup in the tub or shower regularly. Attach bath mats securely with double-sided non-slip rug tape. Do not have throw rugs and other things on the floor that can make you trip. What can I do in the bedroom? Use night lights. Make sure that you have a light by your bed that is easy to reach. Do not use any sheets or blankets that are too big for your bed. They should not hang down onto the floor. Have a firm chair that has side arms. You can use this for support while you get dressed. Do not have throw rugs and other things on the floor that can make you trip. What can I do in the kitchen? Clean up any spills right away. Avoid walking on wet floors. Keep items that you use a lot in easy-to-reach places. If you need to reach something above you, use a strong step stool that has a grab bar. Keep electrical cords out of the way. Do not use floor polish or wax that makes floors slippery. If you must use wax, use non-skid floor wax. Do not have throw  rugs and other things on the floor that can make you trip. What can I do with my stairs? Do not leave any items on the stairs. Make sure that there are handrails on both sides of the stairs and use them. Fix handrails that are broken or loose. Make sure that handrails are as long as the stairways. Check any carpeting to make sure that it is firmly attached to the stairs. Fix any carpet that is loose or worn. Avoid having throw rugs at the top or bottom of the stairs. If you do have throw rugs, attach them to the floor with carpet tape. Make sure that you have a light switch at the top of the stairs and the bottom of the stairs. If you do not have them, ask someone to add them for you. What else can I do to help prevent falls? Wear shoes that: Do not have high heels. Have rubber bottoms. Are comfortable and fit you well. Are closed at the toe. Do not wear sandals. If you use a stepladder: Make sure that it is fully opened.  Do not climb a closed stepladder. Make sure that both sides of the stepladder are locked into place. Ask someone to hold it for you, if possible. Clearly mark and make sure that you can see: Any grab bars or handrails. First and last steps. Where the edge of each step is. Use tools that help you move around (mobility aids) if they are needed. These include: Canes. Walkers. Scooters. Crutches. Turn on the lights when you go into a dark area. Replace any light bulbs as soon as they burn out. Set up your furniture so you have a clear path. Avoid moving your furniture around. If any of your floors are uneven, fix them. If there are any pets around you, be aware of where they are. Review your medicines with your doctor. Some medicines can make you feel dizzy. This can increase your chance of falling. Ask your doctor what other things that you can do to help prevent falls. This information is not intended to replace advice given to you by your health care provider. Make sure you discuss any questions you have with your health care provider. Document Released: 05/28/2009 Document Revised: 01/07/2016 Document Reviewed: 09/05/2014 Elsevier Interactive Patient Education  2017 [employer].

## 2023-01-16 NOTE — Progress Notes (Signed)
I connected with  Brooke Gallagher on 01/16/23 by a audio enabled telemedicine application and verified that I am speaking with the correct person using two identifiers.  Patient Location: Home  Provider Location: Office/Clinic  I discussed the limitations of evaluation and management by telemedicine. The patient expressed understanding and agreed to proceed.  Subjective:   Brooke Gallagher is a 67 y.o. female who presents for Medicare Annual (Subsequent) preventive examination.  Review of Systems     Cardiac Risk Factors include: advanced age (>58men, >76 women);dyslipidemia;hypertension     Objective:    Today's Vitals   01/16/23 0842  Weight: 155 lb (70.3 kg)   Body mass index is 28.35 kg/m.     01/16/2023    8:31 AM 12/30/2021   12:45 PM 12/29/2020    8:37 AM 02/26/2020   11:27 AM 01/15/2020    1:31 PM 12/27/2019    8:42 AM 03/22/2017    9:08 AM  Advanced Directives  Does Patient Have a Medical Advance Directive? Yes Yes Yes Yes Yes Yes No  Type of Estate agent of [place];Living will Healthcare Power of [place];Living will Healthcare Power of [place];Living will Healthcare Power of [place];Living will Healthcare Power of [place];Living will Healthcare Power of [place];Living will   Does patient want to make changes to medical advance directive? No - Patient declined No - Patient declined No - Patient declined   No - Patient declined   Copy of Healthcare Power of Attorney in Chart? Yes - validated most recent copy scanned in chart (See row information) Yes - validated most recent copy scanned in chart (See row information) Yes - validated most recent copy scanned in chart (See row information)   Yes - validated most recent copy scanned in chart (See row information)   Would patient like information on creating a medical advance directive?       Yes (MAU/Ambulatory/Procedural Areas - Information given)    Current Medications (verified) Outpatient  Encounter Medications as of 01/16/2023  Medication Sig   acetaminophen (TYLENOL) 325 MG tablet Take 650 mg by mouth as needed.   amLODipine (NORVASC) 5 MG tablet Take 1 tablet (5 mg total) by mouth daily.   lisinopril (ZESTRIL) 20 MG tablet Take 1 tablet (20 mg total) by mouth daily.   loratadine (CLARITIN) 10 MG tablet Take 10 mg by mouth daily.   Melatonin 10 MG TABS Take 20 mg by mouth at bedtime as needed (sleep).    Multiple Vitamin (MULTIVITAMIN WITH MINERALS) TABS tablet Take 1 tablet by mouth daily. Women's Multivitamin   rosuvastatin (CRESTOR) 5 MG tablet Take 1 tablet (5 mg total) by mouth daily.   No facility-administered encounter medications on file as of 01/16/2023.    Allergies (verified) Penicillins   History: Past Medical History:  Diagnosis Date   Allergy    Colon polyp    Depression    Herpes    Hx: UTI (urinary tract infection)    Hyperlipidemia    Hypertension    Past Surgical History:  Procedure Laterality Date   ABDOMINAL HYSTERECTOMY     BACK SURGERY  1998   lower back   BREAST BIOPSY Left 04/22/2021   [place] Bx, / ribbon clip, BENIGN MAMMARY PARENCHYMA WITH MODERATE STROMAL FIBROSIS AND FOCAL FIBROADENOMATOID CHANGES   LAPAROSCOPIC BILATERAL SALPINGO OOPHERECTOMY Bilateral 01/22/2020   Procedure: LAPAROSCOPIC BILATERAL SALPINGO OOPHORECTOMY;  Surgeon: Leida Lauth, MD;  Location: [facility] ORS;  Service: Gynecology;  Laterality: Bilateral;   OOPHORECTOMY  TONSILLECTOMY  1963   Family History  Problem Relation Age of Onset   Arthritis Mother    Stroke Mother    Hypertension Mother    Alzheimer's disease Mother    Dementia Mother    Colon cancer Father    Prostate cancer Father    Mental illness Sister    Alcohol abuse Brother    Congestive Heart Failure Brother    Cancer - Lung Brother    Colon cancer Paternal Uncle    Colon cancer Maternal Grandmother    Mental illness Maternal Grandmother    Alzheimer's disease Maternal Grandmother     Hyperlipidemia Maternal Grandfather    Hypertension Maternal Grandfather    Diabetes Maternal Grandfather    Arthritis Paternal Grandmother    Alcohol abuse Paternal Grandfather    Breast cancer Neg Hx    Social History   Socioeconomic History   Marital status: Single    Spouse name: Not on file   Number of children: Not on file   Years of education: Not on file   Highest education level: Not on file  Occupational History   Not on file  Tobacco Use   Smoking status: Never   Smokeless tobacco: Never  Vaping Use   Vaping Use: Never used  Substance and Sexual Activity   Alcohol use: Yes    Alcohol/week: 1.0 - 2.0 standard drink of alcohol    Types: 1 - 2 Standard drinks or equivalent per week   Drug use: No   Sexual activity: Not Currently  Other Topics Concern   Not on file  Social History Narrative   Not on file   Social Determinants of Health   Financial Resource Strain: Low Risk  (01/16/2023)   Overall Financial Resource Strain (CARDIA)    Difficulty of Paying Living Expenses: Not hard at all  Food Insecurity: No Food Insecurity (01/16/2023)   Hunger Vital Sign    Worried About Running Out of Food in the Last Year: Never true    Ran Out of Food in the Last Year: Never true  Transportation Needs: No Transportation Needs (01/16/2023)   PRAPARE - Administrator, Civil Service (Medical): No    Lack of Transportation (Non-Medical): No  Physical Activity: Sufficiently Active (01/16/2023)   Exercise Vital Sign    Days of Exercise per Week: 7 days    Minutes of Exercise per Session: 30 min  Stress: No Stress Concern Present (01/16/2023)   [employer] of Occupational Health - Occupational Stress Questionnaire    Feeling of Stress : Only a little  Social Connections: Moderately Isolated (01/16/2023)   Social Connection and Isolation Panel [NHANES]    Frequency of Communication with Friends and Family: Twice a week    Frequency of Social Gatherings with Friends and  Family: More than three times a week    Attends Religious Services: More than 4 times per year    Active Member of [employer] or Organizations: No    Attends Engineer, structural: Never    Marital Status: Divorced    Tobacco Counseling Counseling given: Not Answered   Clinical Intake:  Pre-visit preparation completed: Yes  Pain : No/denies pain     Nutritional Risks: None Diabetes: No  How often do you need to have someone help you when you read instructions, pamphlets, or other written materials from your doctor or pharmacy?: 1 - Never  Diabetic?no  Interpreter Needed?: No  Information entered by :: Kennedy Bucker, LPN  Activities of Daily Living    01/16/2023    8:32 AM  In your present state of health, do you have any difficulty performing the following activities:  Hearing? 0  Vision? 0  Difficulty concentrating or making decisions? 0  Walking or climbing stairs? 0  Dressing or bathing? 0  Doing errands, shopping? 0  Preparing Food and eating ? N  Using the Toilet? N  In the past six months, have you accidently leaked urine? N  Do you have problems with loss of bowel control? N  Managing your Medications? N  Managing your Finances? N  Housekeeping or managing your Housekeeping? N    Patient Care Team: Dale [place], MD as PCP - General (Internal Medicine) Benita Gutter, RN as Oncology Nurse Navigator  Indicate any recent Medical Services you may have received from other than [facility] providers in the past year (date may be approximate).     Assessment:   This is a routine wellness examination for Brooke Gallagher.  Hearing/Vision screen Hearing Screening - Comments:: No aids Vision Screening - Comments:: Wears glasses- Dr.Thurmond  Dietary issues and exercise activities discussed: Current Exercise Habits: Home exercise routine, Type of exercise: walking, Time (Minutes): 30, Frequency (Times/Week): 7, Weekly Exercise (Minutes/Week): 210, Intensity:  Mild   Goals Addressed             This Visit's Progress    DIET - EAT MORE FRUITS AND VEGETABLES         Depression Screen    01/16/2023    8:29 AM 08/31/2022    7:17 AM 04/29/2022    7:54 AM 01/19/2022    8:37 AM 12/30/2021   12:44 PM 08/25/2021   11:56 AM 03/23/2021    8:19 AM  PHQ 2/9 Scores  PHQ - 2 Score 0 0 0 0 0 0 0  PHQ- 9 Score 0 2 0        Fall Risk    01/16/2023    8:32 AM 08/31/2022    7:17 AM 04/29/2022    7:54 AM 01/19/2022    8:37 AM 12/30/2021    1:05 PM  Fall Risk   Falls in the past year? 0 0 0 0 0  Number falls in past yr: 0 0 0  0  Injury with Fall? 0 0 0    Risk for fall due to : No Fall Risks No Fall Risks  No Fall Risks   Follow up Falls prevention discussed;Falls evaluation completed Falls evaluation completed  Falls evaluation completed Falls evaluation completed    FALL RISK PREVENTION PERTAINING TO THE HOME:  Any stairs in or around the home? Yes  If so, are there any without handrails? No  Home free of loose throw rugs in walkways, pet beds, electrical cords, etc? Yes  Adequate lighting in your home to reduce risk of falls? Yes   ASSISTIVE DEVICES UTILIZED TO PREVENT FALLS:  Life alert? No  Use of a cane, walker or w/c? No  Grab bars in the bathroom? Yes  Shower chair or bench in shower? Yes  Elevated toilet seat or a handicapped toilet? No    Cognitive Function:        01/16/2023    8:36 AM 12/27/2019    8:49 AM  6CIT Screen  What Year? 0 points 0 points  What month? 0 points 0 points  What time? 0 points 0 points  Count back from 20 0 points   Months in reverse 0 points 0 points  Repeat phrase 0 points 0 points  Total Score 0 points     Immunizations Immunization History  Administered Date(s) Administered   Fluad Quad(high Dose 65+) 05/21/2019, 07/17/2021, 04/29/2022   Influenza-Unspecified 06/01/2020   PFIZER(Purple Top)SARS-COV-2 Vaccination 09/27/2019, 10/23/2019, 06/01/2020   Pfizer Covid-19 Vaccine Bivalent Booster  50yrs & up 07/03/2021   Pneumococcal Conjugate-13 06/13/2019   Pneumococcal Polysaccharide-23 08/25/2021   Tdap 03/05/2020    TDAP status: Up to date  Flu Vaccine status: Up to date  Pneumococcal vaccine status: Up to date  Covid-19 vaccine status: Completed vaccines  Qualifies for Shingles Vaccine? Yes   Zostavax completed No   Shingrix Completed?: No.    Education has been provided regarding the importance of this vaccine. Patient has been advised to call insurance company to determine out of pocket expense if they have not yet received this vaccine. Advised may also receive vaccine at local pharmacy or Health Dept. Verbalized acceptance and understanding.  Screening Tests Health Maintenance  Topic Date Due   Zoster Vaccines- Shingrix (1 of 2) Never done   COVID-19 Vaccine (5 - 2023-24 season) 04/15/2022   MAMMOGRAM  04/29/2023   Medicare Annual Wellness (AWV)  01/16/2024   Colonoscopy  07/28/2024   DTaP/Tdap/Td (2 - Td or Tdap) 03/05/2030   Pneumonia Vaccine 16+ Years old  Completed   DEXA SCAN  Completed   Hepatitis C Screening  Completed   HPV VACCINES  Aged Out   INFLUENZA VACCINE  Discontinued    Health Maintenance  Health Maintenance Due  Topic Date Due   Zoster Vaccines- Shingrix (1 of 2) Never done   COVID-19 Vaccine (5 - 2023-24 season) 04/15/2022    Colorectal cancer screening: Type of screening: Colonoscopy. Completed 07/28/22. Repeat every 2 years  Mammogram status: Completed 04/28/22. Repeat every year  Bone Density status: Completed 06/17/21. Results reflect: Bone density results: NORMAL. Repeat every 5 years.  Lung Cancer Screening: (Low Dose CT Chest recommended if Age 23-80 years, 30 pack-year currently smoking OR have quit w/in 15years.) does not qualify.    Additional Screening:  Hepatitis C Screening: does qualify; Completed 10/19/20  Vision Screening: Recommended annual ophthalmology exams for early detection of glaucoma and other disorders of  the eye. Is the patient up to date with their annual eye exam?  Yes  Who is the provider or what is the name of the office in which the patient attends annual eye exams? Dr.Thurmond If pt is not established with a provider, would they like to be referred to a provider to establish care? No .   Dental Screening: Recommended annual dental exams for proper oral hygiene  Community Resource Referral / Chronic Care Management: CRR required this visit?  No   CCM required this visit?  No      Plan:     I have personally reviewed and noted the following in the patient's chart:   Medical and social history Use of alcohol, tobacco or illicit drugs  Current medications and supplements including opioid prescriptions. Patient is not currently taking opioid prescriptions. Functional ability and status Nutritional status Physical activity Advanced directives List of other physicians Hospitalizations, surgeries, and ER visits in previous 12 months Vitals Screenings to include cognitive, depression, and falls Referrals and appointments  In addition, I have reviewed and discussed with patient certain preventive protocols, quality metrics, and best practice recommendations. A written personalized care plan for preventive services as well as general preventive health recommendations were provided to patient.     Brooke Gallagher  Tommas Olp, LPN   08/20/1094   Nurse Notes: none

## 2023-02-04 ENCOUNTER — Other Ambulatory Visit: Payer: Self-pay | Admitting: Internal Medicine

## 2023-02-08 ENCOUNTER — Ambulatory Visit (INDEPENDENT_AMBULATORY_CARE_PROVIDER_SITE_OTHER): Payer: Medicare Other | Admitting: Internal Medicine

## 2023-02-08 ENCOUNTER — Encounter: Payer: Self-pay | Admitting: Internal Medicine

## 2023-02-08 VITALS — BP 128/74 | HR 82 | Temp 98.0°F | Resp 16 | Ht 62.0 in | Wt 155.0 lb

## 2023-02-08 DIAGNOSIS — R928 Other abnormal and inconclusive findings on diagnostic imaging of breast: Secondary | ICD-10-CM | POA: Diagnosis not present

## 2023-02-08 DIAGNOSIS — E7800 Pure hypercholesterolemia, unspecified: Secondary | ICD-10-CM | POA: Diagnosis not present

## 2023-02-08 DIAGNOSIS — L989 Disorder of the skin and subcutaneous tissue, unspecified: Secondary | ICD-10-CM | POA: Diagnosis not present

## 2023-02-08 DIAGNOSIS — F439 Reaction to severe stress, unspecified: Secondary | ICD-10-CM

## 2023-02-08 DIAGNOSIS — I700 Atherosclerosis of aorta: Secondary | ICD-10-CM

## 2023-02-08 DIAGNOSIS — I10 Essential (primary) hypertension: Secondary | ICD-10-CM

## 2023-02-08 DIAGNOSIS — Z8249 Family history of ischemic heart disease and other diseases of the circulatory system: Secondary | ICD-10-CM

## 2023-02-08 LAB — LIPID PANEL
Cholesterol: 115 mg/dL (ref 0–200)
HDL: 33.4 mg/dL — ABNORMAL LOW (ref >39.00)
LDL Cholesterol: 60 mg/dL (ref 0–99)
NonHDL: 81.73
Total CHOL/HDL Ratio: 3
Triglycerides: 109 mg/dL (ref 0.0–149.0)
VLDL: 21.8 mg/dL (ref 0.0–40.0)

## 2023-02-08 LAB — BASIC METABOLIC PANEL
BUN: 12 mg/dL (ref 6–23)
CO2: 29 mEq/L (ref 19–32)
Calcium: 9.5 mg/dL (ref 8.4–10.5)
Chloride: 104 mEq/L (ref 96–112)
Creatinine, Ser: 0.7 mg/dL (ref 0.40–1.20)
GFR: 88.38 mL/min (ref >60.00)
Glucose, Bld: 95 mg/dL (ref 70–99)
Potassium: 4 mEq/L (ref 3.5–5.1)
Sodium: 140 mEq/L (ref 135–145)

## 2023-02-08 LAB — HEPATIC FUNCTION PANEL
ALT: 18 U/L (ref 0–35)
AST: 20 U/L (ref 0–37)
Albumin: 4.3 g/dL (ref 3.5–5.2)
Alkaline Phosphatase: 42 U/L (ref 39–117)
Bilirubin, Direct: 0.1 mg/dL (ref 0.0–0.3)
Total Bilirubin: 0.5 mg/dL (ref 0.2–1.2)
Total Protein: 7.1 g/dL (ref 6.0–8.3)

## 2023-02-08 LAB — TSH: TSH: 3.23 u[IU]/mL (ref 0.35–5.50)

## 2023-02-08 MED ORDER — LISINOPRIL 20 MG PO TABS: 20.0000 mg | ORAL_TABLET | Freq: Every day | ORAL | 3 refills | Status: DC

## 2023-02-08 MED ORDER — ROSUVASTATIN CALCIUM 5 MG PO TABS: 5.0000 mg | ORAL_TABLET | Freq: Every day | ORAL | 3 refills | Status: DC

## 2023-02-08 MED ORDER — AMLODIPINE BESYLATE 5 MG PO TABS: 5.0000 mg | ORAL_TABLET | Freq: Every day | ORAL | 3 refills | Status: DC

## 2023-02-08 NOTE — Progress Notes (Signed)
Subjective:    Patient ID: Brooke Gallagher, female    DOB: [birth year], 67 y.o.   MRN: [id number]  Patient here for  Chief Complaint  Patient presents with   Skin lesion    HPI Work in appt - concern regarding arm lesion.  History of hypercholesterolemia and hypertension.  Continues on lisinopril and amlodipine. Blood pressure controlled. Has had the skin lesion for a while, but recently became red and tender.  Some increased swelling.  Is better now.  Decreased swelling.  Pain is better.  Decreased erythema.  Discussed dermatology referral.  Also had questions about her family history.  Two sisters with heart conditions.  States has something to do with their ejection fraction.  Discussed cardiac issues with her.  She is currently asymptomatic.  No chest pain or sob.  No cough or congestion reported.  No abdominal pain or bowel change.    Past Medical History:  Diagnosis Date   Allergy    Colon polyp    Depression    Herpes    Hx: UTI (urinary tract infection)    Hyperlipidemia    Hypertension    Past Surgical History:  Procedure Laterality Date   ABDOMINAL HYSTERECTOMY     BACK SURGERY  1998   lower back   BREAST BIOPSY Left 04/22/2021   [place] Bx, / ribbon clip, BENIGN MAMMARY PARENCHYMA WITH MODERATE STROMAL FIBROSIS AND FOCAL FIBROADENOMATOID CHANGES   LAPAROSCOPIC BILATERAL SALPINGO OOPHERECTOMY Bilateral 01/22/2020   Procedure: LAPAROSCOPIC BILATERAL SALPINGO OOPHORECTOMY;  Surgeon: Leida Lauth, MD;  Location: [facility] ORS;  Service: Gynecology;  Laterality: Bilateral;   OOPHORECTOMY     TONSILLECTOMY  1963   Family History  Problem Relation Age of Onset   Arthritis Mother    Stroke Mother    Hypertension Mother    Alzheimer's disease Mother    Dementia Mother    Colon cancer Father    Prostate cancer Father    Mental illness Sister    Alcohol abuse Brother    Congestive Heart Failure Brother    Cancer - Lung Brother    Colon cancer Paternal Uncle    Colon  cancer Maternal Grandmother    Mental illness Maternal Grandmother    Alzheimer's disease Maternal Grandmother    Hyperlipidemia Maternal Grandfather    Hypertension Maternal Grandfather    Diabetes Maternal Grandfather    Arthritis Paternal Grandmother    Alcohol abuse Paternal Grandfather    Breast cancer Neg Hx    Social History   Socioeconomic History   Marital status: Single    Spouse name: Not on file   Number of children: Not on file   Years of education: Not on file   Highest education level: Not on file  Occupational History   Not on file  Tobacco Use   Smoking status: Never   Smokeless tobacco: Never  Vaping Use   Vaping Use: Never used  Substance and Sexual Activity   Alcohol use: Yes    Alcohol/week: 1.0 - 2.0 standard drink of alcohol    Types: 1 - 2 Standard drinks or equivalent per week   Drug use: No   Sexual activity: Not Currently  Other Topics Concern   Not on file  Social History Narrative   Not on file   Social Determinants of Health   Financial Resource Strain: Low Risk  (01/16/2023)   Overall Financial Resource Strain (CARDIA)    Difficulty of Paying Living Expenses: Not hard at all  Food  Insecurity: No Food Insecurity (01/16/2023)   Hunger Vital Sign    Worried About Running Out of Food in the Last Year: Never true    Ran Out of Food in the Last Year: Never true  Transportation Needs: No Transportation Needs (01/16/2023)   PRAPARE - Administrator, Civil Service (Medical): No    Lack of Transportation (Non-Medical): No  Physical Activity: Sufficiently Active (01/16/2023)   Exercise Vital Sign    Days of Exercise per Week: 7 days    Minutes of Exercise per Session: 30 min  Stress: No Stress Concern Present (01/16/2023)   [employer] of Occupational Health - Occupational Stress Questionnaire    Feeling of Stress : Only a little  Social Connections: Moderately Isolated (01/16/2023)   Social Connection and Isolation Panel [NHANES]     Frequency of Communication with Friends and Family: Twice a week    Frequency of Social Gatherings with Friends and Family: More than three times a week    Attends Religious Services: More than 4 times per year    Active Member of [employer] or Organizations: No    Attends Banker Meetings: Never    Marital Status: Divorced     Review of Systems  Constitutional:  Negative for appetite change and unexpected weight change.  HENT:  Negative for congestion and sinus pressure.   Respiratory:  Negative for cough, chest tightness and shortness of breath.   Cardiovascular:  Negative for chest pain, palpitations and leg swelling.  Gastrointestinal:  Negative for abdominal pain, diarrhea, nausea and vomiting.  Genitourinary:  Negative for difficulty urinating and dysuria.  Musculoskeletal:  Negative for joint swelling and myalgias.  Skin:  Negative for rash.       Skin lesion left arm as outlined.   Neurological:  Negative for dizziness and headaches.  Psychiatric/Behavioral:  Negative for agitation and dysphoric mood.        Objective:     BP 128/74   Pulse 82   Temp 98 F (36.7 C)   Resp 16   Ht 5\' 2"  (1.575 m)   Wt 155 lb (70.3 kg)   SpO2 99%   BMI 28.35 kg/m  Wt Readings from Last 3 Encounters:  02/08/23 155 lb (70.3 kg)  01/16/23 155 lb (70.3 kg)  09/30/22 155 lb (70.3 kg)    Physical Exam Vitals reviewed.  Constitutional:      General: She is not in acute distress.    Appearance: Normal appearance.  HENT:     Head: Normocephalic and atraumatic.     Right Ear: External ear normal.     Left Ear: External ear normal.  Eyes:     General: No scleral icterus.       Right eye: No discharge.        Left eye: No discharge.     Conjunctiva/sclera: Conjunctivae normal.  Neck:     Thyroid: No thyromegaly.  Cardiovascular:     Rate and Rhythm: Normal rate and regular rhythm.  Pulmonary:     Effort: No respiratory distress.     Breath sounds: Normal breath sounds.  No wheezing.  Abdominal:     General: Bowel sounds are normal.     Palpations: Abdomen is soft.     Tenderness: There is no abdominal tenderness.  Musculoskeletal:        General: No swelling or tenderness.     Cervical back: Neck supple. No tenderness.  Lymphadenopathy:     Cervical: No  cervical adenopathy.  Skin:    Findings: No rash.     Comments: Raised lesion - left arm - minimal surrounding erythema.  No significant tenderness.    Neurological:     Mental Status: She is alert.  Psychiatric:        Mood and Affect: Mood normal.        Behavior: Behavior normal.      Outpatient Encounter Medications as of 02/08/2023  Medication Sig   acetaminophen (TYLENOL) 325 MG tablet Take 650 mg by mouth as needed.   amLODipine (NORVASC) 5 MG tablet Take 1 tablet (5 mg total) by mouth daily.   lisinopril (ZESTRIL) 20 MG tablet Take 1 tablet (20 mg total) by mouth daily.   loratadine (CLARITIN) 10 MG tablet Take 10 mg by mouth daily.   Melatonin 10 MG TABS Take 20 mg by mouth at bedtime as needed (sleep).    Multiple Vitamin (MULTIVITAMIN WITH MINERALS) TABS tablet Take 1 tablet by mouth daily. Women's Multivitamin   rosuvastatin (CRESTOR) 5 MG tablet Take 1 tablet (5 mg total) by mouth daily.   [DISCONTINUED] amLODipine (NORVASC) 5 MG tablet Take 1 tablet (5 mg total) by mouth daily.   [DISCONTINUED] lisinopril (ZESTRIL) 20 MG tablet Take 1 tablet by mouth once daily   [DISCONTINUED] rosuvastatin (CRESTOR) 5 MG tablet Take 1 tablet (5 mg total) by mouth daily.   No facility-administered encounter medications on file as of 02/08/2023.     Lab Results  Component Value Date   WBC 5.5 08/31/2022   HGB 13.5 08/31/2022   HCT 39.8 08/31/2022   PLT 289.0 08/31/2022   GLUCOSE 95 02/08/2023   CHOL 115 02/08/2023   TRIG 109.0 02/08/2023   HDL 33.40 (L) 02/08/2023   LDLDIRECT 146.7 05/03/2013   LDLCALC 60 02/08/2023   ALT 18 02/08/2023   AST 20 02/08/2023   NA 140 02/08/2023   K 4.0  02/08/2023   CL 104 02/08/2023   CREATININE 0.70 02/08/2023   BUN 12 02/08/2023   CO2 29 02/08/2023   TSH 3.23 02/08/2023    MM DIAG BREAST TOMO BILATERAL  Result Date: 04/28/2022 CLINICAL DATA:  Short-term follow-up for probably benign left breast asymmetry. EXAM: DIGITAL DIAGNOSTIC BILATERAL MAMMOGRAM WITH TOMOSYNTHESIS TECHNIQUE: Bilateral digital diagnostic mammography and breast tomosynthesis was performed. COMPARISON:  Previous exam(s). ACR Breast Density Category b: There are scattered areas of fibroglandular density. FINDINGS: No suspicious masses or calcifications are seen in either breast. Small focal asymmetry in the outer periareolar left breast is unchanged. There is no mammographic evidence of malignancy in either breast. IMPRESSION: Stable probably benign left breast asymmetry. No mammographic evidence of malignancy in either breast. RECOMMENDATION: Recommend bilateral diagnostic mammography in 1 year which will demonstrate a total of 2 years of stability of the probably benign left breast asymmetry. I have discussed the findings and recommendations with the patient. If applicable, a reminder letter will be sent to the patient regarding the next appointment. BI-RADS CATEGORY  3: Probably benign. Electronically Signed   By: Edwin Cap M.D.   On: 04/28/2022 09:54      Assessment & Plan:  Hypercholesterolemia Assessment & Plan: Low cholesterol diet and exercise.  Continue crestor.  Follow lipid panel and liver function tests.   Orders: -     Hepatic function panel -     Lipid panel -     TSH  Essential hypertension, benign Assessment & Plan: Blood pressure better as outlined.  Continue lisinopril and amlodipine. Follow pressures.  Follow metabolic panel.    Orders: -     Basic metabolic panel  Skin lesion Assessment & Plan: Persistent.  Increased redness recently.  Better now, but persistent.  Request referral to dermatology for further evaluation and treatment.    Orders: -     Ambulatory referral to Dermatology  Abnormal mammogram -     MM 3D DIAGNOSTIC MAMMOGRAM BILATERAL BREAST; Future  Aortic atherosclerosis (HCC) Assessment & Plan: Low cholesterol diet and exercise.  Consider crestor.    Stress Assessment & Plan: Overall appears to be handling things well.  Follow.     Family history of heart disease Assessment & Plan: Unclear diagnosis as outlined.  She will get more information regarding family history.  Did discuss calcium score.  Follow.     Other orders -     amLODIPine Besylate; Take 1 tablet (5 mg total) by mouth daily.  Dispense: 90 tablet; Refill: 3 -     Lisinopril; Take 1 tablet (20 mg total) by mouth daily.  Dispense: 90 tablet; Refill: 3 -     Rosuvastatin Calcium; Take 1 tablet (5 mg total) by mouth daily.  Dispense: 90 tablet; Refill: 3     [place], MD

## 2023-02-12 ENCOUNTER — Encounter: Payer: Self-pay | Admitting: Internal Medicine

## 2023-02-12 DIAGNOSIS — L989 Disorder of the skin and subcutaneous tissue, unspecified: Secondary | ICD-10-CM | POA: Insufficient documentation

## 2023-02-12 DIAGNOSIS — Z8249 Family history of ischemic heart disease and other diseases of the circulatory system: Secondary | ICD-10-CM | POA: Insufficient documentation

## 2023-02-12 NOTE — Assessment & Plan Note (Signed)
Persistent.  Increased redness recently.  Better now, but persistent.  Request referral to dermatology for further evaluation and treatment.

## 2023-02-12 NOTE — Assessment & Plan Note (Signed)
Overall appears to be handling things well.  Follow.  ?

## 2023-02-12 NOTE — Assessment & Plan Note (Signed)
Blood pressure better as outlined.  Continue lisinopril and amlodipine. Follow pressures.  Follow metabolic panel.   

## 2023-02-12 NOTE — Assessment & Plan Note (Signed)
Low cholesterol diet and exercise.  Continue crestor.  Follow lipid panel and liver function tests.   

## 2023-02-12 NOTE — Assessment & Plan Note (Signed)
Low cholesterol diet and exercise.  Consider crestor.  

## 2023-02-12 NOTE — Assessment & Plan Note (Signed)
Unclear diagnosis as outlined.  She will get more information regarding family history.  Did discuss calcium score.  Follow.

## 2023-05-01 ENCOUNTER — Ambulatory Visit
Admission: RE | Admit: 2023-05-01 | Discharge: 2023-05-01 | Disposition: A | Discharge status: Home / Self Care | Payer: Medicare Other | Source: Ambulatory Visit | Attending: Internal Medicine | Admitting: Internal Medicine

## 2023-05-01 ENCOUNTER — Other Ambulatory Visit: Payer: Self-pay | Admitting: Internal Medicine

## 2023-05-01 DIAGNOSIS — R928 Other abnormal and inconclusive findings on diagnostic imaging of breast: Secondary | ICD-10-CM

## 2023-06-12 ENCOUNTER — Encounter: Payer: Medicare Other | Admitting: Internal Medicine

## 2023-08-15 ENCOUNTER — Telehealth: Payer: Self-pay

## 2023-08-15 MED ORDER — LISINOPRIL 20 MG PO TABS: 20.0000 mg | ORAL_TABLET | Freq: Every day | ORAL | 3 refills | Status: DC

## 2023-08-15 NOTE — Telephone Encounter (Signed)
 Copied from CRM [id number]. Topic: Clinical - Medication Refill >> Aug 15, 2023  9:15 AM Zacyrah J wrote: Most Recent Primary Care Visit:  Provider: SCOTT, CHARLENE  Department: [facility]  Visit Type: OFFICE VISIT  Date: 02/08/2023  Medication: lisinopril  (ZESTRIL ) 20 MG tablet   Has the patient contacted their pharmacy? Yes (Agent: If no, request that the patient contact the pharmacy for the refill. If patient does not wish to contact the pharmacy document the reason why and proceed with request.) (Agent: If yes, when and what did the pharmacy advise?)  Is this the correct pharmacy for this prescription? Yes If no, delete pharmacy and type the correct one.  This is the patient's preferred pharmacy:  [facility] [address] [place] - [address] [place] [address] Phone: [phone] Fax: [phone]   Has the prescription been filled recently? No  Is the patient out of the medication? Yes  Has the patient been seen for an appointment in the last year OR does the patient have an upcoming appointment? Yes  Can we respond through MyChart? Yes  Agent: Please be advised that Rx refills may take up to 3 business days. We ask that you follow-up with your pharmacy.

## 2023-08-15 NOTE — Addendum Note (Signed)
Addended by: Rita Ohara D on: 08/15/2023 10:26 AM   Modules accepted: Orders

## 2023-08-15 NOTE — Telephone Encounter (Signed)
Refill sent. Patient is aware.

## 2023-09-14 ENCOUNTER — Ambulatory Visit: Payer: Medicare Other | Admitting: Internal Medicine

## 2023-09-14 ENCOUNTER — Encounter: Payer: Self-pay | Admitting: Internal Medicine

## 2023-09-14 VITALS — BP 114/68 | HR 76 | Temp 97.6°F | Resp 16 | Ht 68.0 in | Wt 143.6 lb

## 2023-09-14 DIAGNOSIS — I10 Essential (primary) hypertension: Secondary | ICD-10-CM

## 2023-09-14 DIAGNOSIS — E7800 Pure hypercholesterolemia, unspecified: Secondary | ICD-10-CM

## 2023-09-14 DIAGNOSIS — F439 Reaction to severe stress, unspecified: Secondary | ICD-10-CM

## 2023-09-14 DIAGNOSIS — R32 Unspecified urinary incontinence: Secondary | ICD-10-CM

## 2023-09-14 DIAGNOSIS — Z0000 Encounter for general adult medical examination without abnormal findings: Secondary | ICD-10-CM

## 2023-09-14 DIAGNOSIS — I700 Atherosclerosis of aorta: Secondary | ICD-10-CM | POA: Diagnosis not present

## 2023-09-14 DIAGNOSIS — Z23 Encounter for immunization: Secondary | ICD-10-CM | POA: Diagnosis not present

## 2023-09-14 DIAGNOSIS — K5730 Diverticulosis of large intestine without perforation or abscess without bleeding: Secondary | ICD-10-CM

## 2023-09-14 LAB — HEPATIC FUNCTION PANEL
ALT: 14 U/L (ref 0–35)
AST: 16 U/L (ref 0–37)
Albumin: 4.3 g/dL (ref 3.5–5.2)
Alkaline Phosphatase: 43 U/L (ref 39–117)
Bilirubin, Direct: 0.1 mg/dL (ref 0.0–0.3)
Total Bilirubin: 0.4 mg/dL (ref 0.2–1.2)
Total Protein: 7.4 g/dL (ref 6.0–8.3)

## 2023-09-14 LAB — CBC WITH DIFFERENTIAL/PLATELET
Basophils Absolute: 0.1 10*3/uL (ref 0.0–0.1)
Basophils Relative: 1.1 % (ref 0.0–3.0)
Eosinophils Absolute: 0 10*3/uL (ref 0.0–0.7)
Eosinophils Relative: 0.6 % (ref 0.0–5.0)
HCT: 40.1 % (ref 36.0–46.0)
Hemoglobin: 13.3 g/dL (ref 12.0–15.0)
Lymphocytes Relative: 27.3 % (ref 12.0–46.0)
Lymphs Abs: 1.7 10*3/uL (ref 0.7–4.0)
MCHC: 33.1 g/dL (ref 30.0–36.0)
MCV: 94.4 fL (ref 78.0–100.0)
Monocytes Absolute: 0.4 10*3/uL (ref 0.1–1.0)
Monocytes Relative: 5.8 % (ref 3.0–12.0)
Neutro Abs: 4.1 10*3/uL (ref 1.4–7.7)
Neutrophils Relative %: 65.2 % (ref 43.0–77.0)
Platelets: 381 10*3/uL (ref 150.0–400.0)
RBC: 4.24 Mil/uL (ref 3.87–5.11)
RDW: 13.2 % (ref 11.5–15.5)
WBC: 6.3 10*3/uL (ref 4.0–10.5)

## 2023-09-14 LAB — BASIC METABOLIC PANEL
BUN: 17 mg/dL (ref 6–23)
CO2: 29 meq/L (ref 19–32)
Calcium: 9.2 mg/dL (ref 8.4–10.5)
Chloride: 105 meq/L (ref 96–112)
Creatinine, Ser: 0.7 mg/dL (ref 0.40–1.20)
GFR: 88.01 mL/min (ref >60.00)
Glucose, Bld: 84 mg/dL (ref 70–99)
Potassium: 4 meq/L (ref 3.5–5.1)
Sodium: 142 meq/L (ref 135–145)

## 2023-09-14 LAB — LIPID PANEL
Cholesterol: 107 mg/dL (ref 0–200)
HDL: 29.2 mg/dL — ABNORMAL LOW (ref >39.00)
LDL Cholesterol: 59 mg/dL (ref 0–99)
NonHDL: 77.81
Total CHOL/HDL Ratio: 4
Triglycerides: 93 mg/dL (ref 0.0–149.0)
VLDL: 18.6 mg/dL (ref 0.0–40.0)

## 2023-09-14 LAB — TSH: TSH: 3.45 u[IU]/mL (ref 0.35–5.50)

## 2023-09-14 NOTE — Assessment & Plan Note (Signed)
Physical today 09/14/23.  Colonoscopy 07/2022 - internal hemorrhoids, diverticulosis.  F/u colonoscopy in 2-3 years. PAP 05/2019 - negative with negative HPV. Mammogram (bilateral diagnostic) - 04/28/22 - Birads III.  Recommended f/u bilateral diagnostic mammogram in one year. Mammogram 05/01/23 - Birads II.

## 2023-09-14 NOTE — Assessment & Plan Note (Signed)
Overall appears to be handling things well.  Follow.  ?

## 2023-09-14 NOTE — Assessment & Plan Note (Signed)
Continue crestor

## 2023-09-14 NOTE — Assessment & Plan Note (Signed)
Noted on colonoscopy.  Keep bowels moving.

## 2023-09-14 NOTE — Assessment & Plan Note (Signed)
Low cholesterol diet and exercise.  Continue crestor.  Follow lipid panel and liver function tests.

## 2023-09-14 NOTE — Assessment & Plan Note (Signed)
Discussed PFPT. Will notify me if agreeable.

## 2023-09-14 NOTE — Progress Notes (Signed)
Subjective:    Patient ID: Brooke Gallagher, female    DOB: [birth year], 68 y.o.   MRN: [id number]   HPI With past history of hypercholesterolemia and hypertension, comes in today to follow up on these issues as well as for a complete physical exam. Some increased stress. Her brother recently passed. She has good support. Does not feel needs any further intervention at this time. Breathing stable. No abdominal pain. Some minimal constipation. Discussed benefiber. Some urinary leakage. Discussed PFPT. Will notify me if agreeable.    Past Medical History:  Diagnosis Date   Allergy    Colon polyp    Depression    Herpes    Hx: UTI (urinary tract infection)    Hyperlipidemia    Hypertension    Past Surgical History:  Procedure Laterality Date   ABDOMINAL HYSTERECTOMY     BACK SURGERY  1998   lower back   BREAST BIOPSY Left 04/22/2021   [place] Bx, / ribbon clip, BENIGN MAMMARY PARENCHYMA WITH MODERATE STROMAL FIBROSIS AND FOCAL FIBROADENOMATOID CHANGES   LAPAROSCOPIC BILATERAL SALPINGO OOPHERECTOMY Bilateral 01/22/2020   Procedure: LAPAROSCOPIC BILATERAL SALPINGO OOPHORECTOMY;  Surgeon: Leida Lauth, MD;  Location: [facility] ORS;  Service: Gynecology;  Laterality: Bilateral;   OOPHORECTOMY     TONSILLECTOMY  1963   Family History  Problem Relation Age of Onset   Arthritis Mother    Stroke Mother    Hypertension Mother    Alzheimer's disease Mother    Dementia Mother    Colon cancer Father    Prostate cancer Father    Mental illness Sister    Alcohol abuse Brother    Congestive Heart Failure Brother    Cancer - Lung Brother    Colon cancer Paternal Uncle    Colon cancer Maternal Grandmother    Mental illness Maternal Grandmother    Alzheimer's disease Maternal Grandmother    Hyperlipidemia Maternal Grandfather    Hypertension Maternal Grandfather    Diabetes Maternal Grandfather    Arthritis Paternal Grandmother    Alcohol abuse Paternal Grandfather    Breast cancer Neg Hx     Social History   Socioeconomic History   Marital status: Single    Spouse name: Not on file   Number of children: Not on file   Years of education: Not on file   Highest education level: Not on file  Occupational History   Not on file  Tobacco Use   Smoking status: Never   Smokeless tobacco: Never  Vaping Use   Vaping status: Never Used  Substance and Sexual Activity   Alcohol use: Yes    Alcohol/week: 1.0 - 2.0 standard drink of alcohol    Types: 1 - 2 Standard drinks or equivalent per week   Drug use: No   Sexual activity: Not Currently  Other Topics Concern   Not on file  Social History Narrative   Not on file   Social Drivers of Health   Financial Resource Strain: Low Risk  (01/16/2023)   Overall Financial Resource Strain (CARDIA)    Difficulty of Paying Living Expenses: Not hard at all  Food Insecurity: No Food Insecurity (01/16/2023)   Hunger Vital Sign    Worried About Running Out of Food in the Last Year: Never true    Ran Out of Food in the Last Year: Never true  Transportation Needs: No Transportation Needs (01/16/2023)   PRAPARE - Administrator, Civil Service (Medical): No    Lack of Transportation (  Non-Medical): No  Physical Activity: Sufficiently Active (01/16/2023)   Exercise Vital Sign    Days of Exercise per Week: 7 days    Minutes of Exercise per Session: 30 min  Stress: No Stress Concern Present (01/16/2023)   [employer] of Occupational Health - Occupational Stress Questionnaire    Feeling of Stress : Only a little  Social Connections: Moderately Isolated (01/16/2023)   Social Connection and Isolation Panel [NHANES]    Frequency of Communication with Friends and Family: Twice a week    Frequency of Social Gatherings with Friends and Family: More than three times a week    Attends Religious Services: More than 4 times per year    Active Member of [employer] or Organizations: No    Attends Banker Meetings: Never    Marital  Status: Divorced     Review of Systems  Constitutional:  Negative for appetite change and unexpected weight change.  HENT:  Negative for congestion, sinus pressure and sore throat.   Eyes:  Negative for pain and visual disturbance.  Respiratory:  Negative for cough, chest tightness and shortness of breath.   Cardiovascular:  Negative for chest pain, palpitations and leg swelling.  Gastrointestinal:  Negative for abdominal pain, diarrhea, nausea and vomiting.  Genitourinary:  Negative for difficulty urinating and dysuria.       Urinary leakage as outlined.   Musculoskeletal:  Negative for joint swelling and myalgias.  Skin:  Negative for color change and rash.  Neurological:  Negative for dizziness and headaches.  Hematological:  Negative for adenopathy. Does not bruise/bleed easily.  Psychiatric/Behavioral:  Negative for agitation and dysphoric mood.        Objective:     BP 114/68   Pulse 76   Temp 97.6 F (36.4 C)   Resp 16   Ht 5\' 8"  (1.727 m)   Wt 143 lb 9.6 oz (65.1 kg)   SpO2 98%   BMI 21.83 kg/m  Wt Readings from Last 3 Encounters:  09/14/23 143 lb 9.6 oz (65.1 kg)  02/08/23 155 lb (70.3 kg)  01/16/23 155 lb (70.3 kg)    Physical Exam Vitals reviewed.  Constitutional:      General: She is not in acute distress.    Appearance: Normal appearance. She is well-developed.  HENT:     Head: Normocephalic and atraumatic.     Right Ear: External ear normal.     Left Ear: External ear normal.     Mouth/Throat:     Pharynx: No oropharyngeal exudate or posterior oropharyngeal erythema.  Eyes:     General: No scleral icterus.       Right eye: No discharge.        Left eye: No discharge.     Conjunctiva/sclera: Conjunctivae normal.  Neck:     Thyroid: No thyromegaly.  Cardiovascular:     Rate and Rhythm: Normal rate and regular rhythm.  Pulmonary:     Effort: No tachypnea, accessory muscle usage or respiratory distress.     Breath sounds: Normal breath sounds.  No decreased breath sounds or wheezing.  Chest:  Breasts:    Right: No inverted nipple, mass, nipple discharge or tenderness (no axillary adenopathy).     Left: No inverted nipple, mass, nipple discharge or tenderness (no axilarry adenopathy).  Abdominal:     General: Bowel sounds are normal.     Palpations: Abdomen is soft.     Tenderness: There is no abdominal tenderness.  Musculoskeletal:  General: No swelling or tenderness.     Cervical back: Neck supple.  Lymphadenopathy:     Cervical: No cervical adenopathy.  Skin:    Findings: No erythema or rash.  Neurological:     Mental Status: She is alert and oriented to person, place, and time.  Psychiatric:        Mood and Affect: Mood normal.        Behavior: Behavior normal.         Outpatient Encounter Medications as of 09/14/2023  Medication Sig   acetaminophen (TYLENOL) 325 MG tablet Take 650 mg by mouth as needed.   amLODipine (NORVASC) 5 MG tablet Take 1 tablet (5 mg total) by mouth daily.   lisinopril (ZESTRIL) 20 MG tablet Take 1 tablet (20 mg total) by mouth daily.   loratadine (CLARITIN) 10 MG tablet Take 10 mg by mouth daily.   Melatonin 10 MG TABS Take 20 mg by mouth at bedtime as needed (sleep).    Multiple Vitamin (MULTIVITAMIN WITH MINERALS) TABS tablet Take 1 tablet by mouth daily. Women's Multivitamin   rosuvastatin (CRESTOR) 5 MG tablet Take 1 tablet (5 mg total) by mouth daily.   No facility-administered encounter medications on file as of 09/14/2023.     Lab Results  Component Value Date   WBC 5.5 08/31/2022   HGB 13.5 08/31/2022   HCT 39.8 08/31/2022   PLT 289.0 08/31/2022   GLUCOSE 95 02/08/2023   CHOL 115 02/08/2023   TRIG 109.0 02/08/2023   HDL 33.40 (L) 02/08/2023   LDLDIRECT 146.7 05/03/2013   LDLCALC 60 02/08/2023   ALT 18 02/08/2023   AST 20 02/08/2023   NA 140 02/08/2023   K 4.0 02/08/2023   CL 104 02/08/2023   CREATININE 0.70 02/08/2023   BUN 12 02/08/2023   CO2 29 02/08/2023    TSH 3.23 02/08/2023    MM 3D DIAGNOSTIC MAMMOGRAM BILATERAL BREAST Result Date: 05/01/2023 CLINICAL DATA:  BI-RADS 3 follow-up of LEFT breast asymmetry, initiated September 2022. Ultrasound-guided biopsy was performed on the favored correlate September 2022 with benign results. However sonographic finding did not correlate to the mammographic finding. EXAM: DIGITAL DIAGNOSTIC BILATERAL MAMMOGRAM WITH TOMOSYNTHESIS AND CAD; ULTRASOUND LEFT BREAST LIMITED TECHNIQUE: Bilateral digital diagnostic mammography and breast tomosynthesis was performed. The images were evaluated with computer-aided detection. ; Targeted ultrasound examination of the left breast was performed. COMPARISON:  Previous exam(s). ACR Breast Density Category b: There are scattered areas of fibroglandular density. FINDINGS: Diagnostic images of the LEFT breast demonstrate resolution of a LEFT breast asymmetry. RIBBON shaped biopsy marking clip is noted in the outer breast without new adjacent suspicious findings. No suspicious mass, distortion, or microcalcifications are identified to suggest presence of malignancy. Targeted ultrasound was performed of the LEFT upper outer breast. No suspicious cystic or solid mass is seen at the site of prior mammographic concern. IMPRESSION: No mammographic evidence of malignancy bilaterally. Interval resolution of LEFT breast asymmetry. RECOMMENDATION: Screening mammogram in one year.(Code:SM-B-01Y) I have discussed the findings and recommendations with the patient. If applicable, a reminder letter will be sent to the patient regarding the next appointment. BI-RADS CATEGORY  2: Benign. Electronically Signed   By: Meda Klinefelter M.D.   On: 05/01/2023 12:22   [place] LIMITED ULTRASOUND INCLUDING AXILLA LEFT BREAST  Result Date: 05/01/2023 CLINICAL DATA:  BI-RADS 3 follow-up of LEFT breast asymmetry, initiated September 2022. Ultrasound-guided biopsy was performed on the favored correlate September 2022 with  benign results. However sonographic finding did not correlate  to the mammographic finding. EXAM: DIGITAL DIAGNOSTIC BILATERAL MAMMOGRAM WITH TOMOSYNTHESIS AND CAD; ULTRASOUND LEFT BREAST LIMITED TECHNIQUE: Bilateral digital diagnostic mammography and breast tomosynthesis was performed. The images were evaluated with computer-aided detection. ; Targeted ultrasound examination of the left breast was performed. COMPARISON:  Previous exam(s). ACR Breast Density Category b: There are scattered areas of fibroglandular density. FINDINGS: Diagnostic images of the LEFT breast demonstrate resolution of a LEFT breast asymmetry. RIBBON shaped biopsy marking clip is noted in the outer breast without new adjacent suspicious findings. No suspicious mass, distortion, or microcalcifications are identified to suggest presence of malignancy. Targeted ultrasound was performed of the LEFT upper outer breast. No suspicious cystic or solid mass is seen at the site of prior mammographic concern. IMPRESSION: No mammographic evidence of malignancy bilaterally. Interval resolution of LEFT breast asymmetry. RECOMMENDATION: Screening mammogram in one year.(Code:SM-B-01Y) I have discussed the findings and recommendations with the patient. If applicable, a reminder letter will be sent to the patient regarding the next appointment. BI-RADS CATEGORY  2: Benign. Electronically Signed   By: Meda Klinefelter M.D.   On: 05/01/2023 12:22       Assessment & Plan:  Hypercholesterolemia Assessment & Plan: Low cholesterol diet and exercise.  Continue crestor.  Follow lipid panel and liver function tests.   Orders: -     CBC with Differential/Platelet -     Hepatic function panel -     TSH -     Lipid panel  Hypertension, unspecified type -     Basic metabolic panel  Healthcare maintenance Assessment & Plan: Physical today 09/14/23.  Colonoscopy 07/2022 - internal hemorrhoids, diverticulosis.  F/u colonoscopy in 2-3 years. PAP 05/2019 -  negative with negative HPV. Mammogram (bilateral diagnostic) - 04/28/22 - Birads III.  Recommended f/u bilateral diagnostic mammogram in one year. Mammogram 05/01/23 - Birads II.    Immunization due -     Flu Vaccine Trivalent High Dose (Fluad)  Aortic atherosclerosis (HCC) Assessment & Plan: Continue crestor.   Diverticulosis of colon Assessment & Plan: Noted on colonoscopy.  Keep bowels moving.    Essential hypertension, benign Assessment & Plan: Blood pressure better as outlined.  Continue lisinopril and amlodipine. Follow pressures.  Follow metabolic panel.     Stress Assessment & Plan: Overall appears to be handling things well.  Follow.     Urinary incontinence, unspecified type Assessment & Plan: Discussed PFPT. Will notify me if agreeable.       Dale [place]boro, MD

## 2023-09-14 NOTE — Assessment & Plan Note (Signed)
Blood pressure better as outlined.  Continue lisinopril and amlodipine. Follow pressures.  Follow metabolic panel.

## 2023-09-15 ENCOUNTER — Encounter: Payer: Self-pay | Admitting: Internal Medicine

## 2023-10-25 ENCOUNTER — Ambulatory Visit: Payer: Medicare Other | Admitting: Dermatology

## 2024-01-12 ENCOUNTER — Ambulatory Visit
Admission: RE | Admit: 2024-01-12 | Discharge: 2024-01-12 | Disposition: A | Discharge status: Home / Self Care | Source: Ambulatory Visit | Attending: Internal Medicine | Admitting: Internal Medicine

## 2024-01-12 ENCOUNTER — Ambulatory Visit
Admission: RE | Admit: 2024-01-12 | Discharge: 2024-01-12 | Disposition: A | Discharge status: Home / Self Care | Attending: Internal Medicine | Admitting: Internal Medicine

## 2024-01-12 ENCOUNTER — Ambulatory Visit: Payer: Medicare Other | Admitting: Internal Medicine

## 2024-01-12 ENCOUNTER — Encounter: Payer: Self-pay | Admitting: Internal Medicine

## 2024-01-12 ENCOUNTER — Ambulatory Visit: Payer: Self-pay | Admitting: Internal Medicine

## 2024-01-12 VITALS — BP 124/78 | HR 73 | Ht 68.0 in | Wt 141.4 lb

## 2024-01-12 DIAGNOSIS — M79671 Pain in right foot: Secondary | ICD-10-CM | POA: Insufficient documentation

## 2024-01-12 DIAGNOSIS — I700 Atherosclerosis of aorta: Secondary | ICD-10-CM | POA: Diagnosis not present

## 2024-01-12 DIAGNOSIS — F439 Reaction to severe stress, unspecified: Secondary | ICD-10-CM

## 2024-01-12 DIAGNOSIS — E7800 Pure hypercholesterolemia, unspecified: Secondary | ICD-10-CM | POA: Diagnosis not present

## 2024-01-12 DIAGNOSIS — I10 Essential (primary) hypertension: Secondary | ICD-10-CM | POA: Diagnosis not present

## 2024-01-12 LAB — URIC ACID: Uric Acid, Serum: 4.4 mg/dL (ref 2.4–7.0)

## 2024-01-12 LAB — LIPID PANEL
Cholesterol: 113 mg/dL (ref 0–200)
HDL: 32.5 mg/dL — ABNORMAL LOW (ref >39.00)
LDL Cholesterol: 59 mg/dL (ref 0–99)
NonHDL: 80.33
Total CHOL/HDL Ratio: 3
Triglycerides: 109 mg/dL (ref 0.0–149.0)
VLDL: 21.8 mg/dL (ref 0.0–40.0)

## 2024-01-12 LAB — BASIC METABOLIC PANEL WITH GFR
BUN: 16 mg/dL (ref 6–23)
CO2: 29 meq/L (ref 19–32)
Calcium: 9.6 mg/dL (ref 8.4–10.5)
Chloride: 104 meq/L (ref 96–112)
Creatinine, Ser: 0.72 mg/dL (ref 0.40–1.20)
GFR: 84.89 mL/min (ref >60.00)
Glucose, Bld: 95 mg/dL (ref 70–99)
Potassium: 4.1 meq/L (ref 3.5–5.1)
Sodium: 141 meq/L (ref 135–145)

## 2024-01-12 LAB — HEPATIC FUNCTION PANEL
ALT: 17 U/L (ref 0–35)
AST: 19 U/L (ref 0–37)
Albumin: 4.4 g/dL (ref 3.5–5.2)
Alkaline Phosphatase: 46 U/L (ref 39–117)
Bilirubin, Direct: 0.1 mg/dL (ref 0.0–0.3)
Total Bilirubin: 0.5 mg/dL (ref 0.2–1.2)
Total Protein: 7.4 g/dL (ref 6.0–8.3)

## 2024-01-12 LAB — SEDIMENTATION RATE: Sed Rate: 15 mm/h (ref 0–30)

## 2024-01-12 MED ORDER — METHYLPREDNISOLONE 4 MG PO TBPK: ORAL_TABLET | ORAL | 0 refills | Status: DC

## 2024-01-12 MED ORDER — AMLODIPINE BESYLATE 5 MG PO TABS: 5.0000 mg | ORAL_TABLET | Freq: Every day | ORAL | 3 refills | Status: AC

## 2024-01-12 MED ORDER — ROSUVASTATIN CALCIUM 5 MG PO TABS: 5.0000 mg | ORAL_TABLET | Freq: Every day | ORAL | 3 refills | Status: AC

## 2024-01-12 NOTE — Progress Notes (Signed)
 Subjective:    Patient ID: Brooke Gallagher, female    DOB: May 19, 1956, 68 y.o.   MRN: [id number]  Patient here for  Chief Complaint  Patient presents with   Medical Management of Chronic Issues    4 month follow up     HPI Here for a scheduled follow up - follow up regarding hypercholesterolemia and hypertension. Some increased stress recently. Had a couple of friends and another family member to pass. Discussed counseling. She does not feel she needs this at this point. Has good support. Does report increased right foot pain - mid foot. Intermittent flares. When occurs, will apply ice and stretches/exercise. May last for a few days. No known injury or trauma. Increased pain now. No other joints involved.    Past Medical History:  Diagnosis Date   Allergy    Colon polyp    Depression    Herpes    Hx: UTI (urinary tract infection)    Hyperlipidemia    Hypertension    Past Surgical History:  Procedure Laterality Date   ABDOMINAL HYSTERECTOMY     BACK SURGERY  1998   lower back   BREAST BIOPSY Left 04/22/2021   US  Bx, / ribbon clip, BENIGN MAMMARY PARENCHYMA WITH MODERATE STROMAL FIBROSIS AND FOCAL FIBROADENOMATOID CHANGES   LAPAROSCOPIC BILATERAL SALPINGO OOPHERECTOMY Bilateral 01/22/2020   Procedure: LAPAROSCOPIC BILATERAL SALPINGO OOPHORECTOMY;  Surgeon: Hermine Loots, MD;  Location: [facility];  Service: Gynecology;  Laterality: Bilateral;   OOPHORECTOMY     TONSILLECTOMY  1963   Family History  Problem Relation Age of Onset   Arthritis Mother    Stroke Mother    Hypertension Mother    Alzheimer's disease Mother    Dementia Mother    Colon cancer Father    Prostate cancer Father    Mental illness Sister    Alcohol abuse Brother    Congestive Heart Failure Brother    Cancer - Lung Brother    Colon cancer Paternal Uncle    Colon cancer Maternal Grandmother    Mental illness Maternal Grandmother    Alzheimer's disease Maternal Grandmother    Hyperlipidemia  Maternal Grandfather    Hypertension Maternal Grandfather    Diabetes Maternal Grandfather    Arthritis Paternal Grandmother    Alcohol abuse Paternal Grandfather    Breast cancer Neg Hx    Social History   Socioeconomic History   Marital status: Single    Spouse name: Not on file   Number of children: Not on file   Years of education: Not on file   Highest education level: Not on file  Occupational History   Not on file  Tobacco Use   Smoking status: Never   Smokeless tobacco: Never  Vaping Use   Vaping status: Never Used  Substance and Sexual Activity   Alcohol use: Yes    Alcohol/week: 1.0 - 2.0 standard drink of alcohol    Types: 1 - 2 Standard drinks or equivalent per week   Drug use: No   Sexual activity: Not Currently  Other Topics Concern   Not on file  Social History Narrative   Not on file   Social Drivers of Health   Financial Resource Strain: Low Risk  (01/16/2023)   Overall Financial Resource Strain (CARDIA)    Difficulty of Paying Living Expenses: Not hard at all  Food Insecurity: No Food Insecurity (01/16/2023)   Hunger Vital Sign    Worried About Running Out of Food in the Last Year:  Never true    Ran Out of Food in the Last Year: Never true  Transportation Needs: No Transportation Needs (01/16/2023)   PRAPARE - Administrator, Civil Service (Medical): No    Lack of Transportation (Non-Medical): No  Physical Activity: Sufficiently Active (01/16/2023)   Exercise Vital Sign    Days of Exercise per Week: 7 days    Minutes of Exercise per Session: 30 min  Stress: No Stress Concern Present (01/16/2023)   [employer] of Occupational Health - Occupational Stress Questionnaire    Feeling of Stress : Only a little  Social Connections: Moderately Isolated (01/16/2023)   Social Connection and Isolation Panel [NHANES]    Frequency of Communication with Friends and Family: Twice a week    Frequency of Social Gatherings with Friends and Family: More than  three times a week    Attends Religious Services: More than 4 times per year    Active Member of [employer] or Organizations: No    Attends Banker Meetings: Never    Marital Status: Divorced     Review of Systems  Constitutional:  Negative for appetite change, fever and unexpected weight change.  HENT:  Negative for congestion and sinus pressure.   Respiratory:  Negative for cough, chest tightness and shortness of breath.   Cardiovascular:  Negative for chest pain, palpitations and leg swelling.  Gastrointestinal:  Negative for abdominal pain, diarrhea, nausea and vomiting.  Genitourinary:  Negative for difficulty urinating and dysuria.  Musculoskeletal:  Negative for myalgias.       Right foot pain as outlined.   Skin:        Minimal soft tissue swelling and some erythema - right mid foot.   Neurological:  Negative for dizziness and headaches.  Psychiatric/Behavioral:  Negative for agitation and dysphoric mood.        Objective:     BP 124/78   Pulse 73   Ht 5\' 8"  (1.727 m)   Wt 141 lb 6.4 oz (64.1 kg)   SpO2 96%   BMI 21.50 kg/m  Wt Readings from Last 3 Encounters:  01/12/24 141 lb 6.4 oz (64.1 kg)  09/14/23 143 lb 9.6 oz (65.1 kg)  02/08/23 155 lb (70.3 kg)    Physical Exam Vitals reviewed.  Constitutional:      General: She is not in acute distress.    Appearance: Normal appearance.  HENT:     Head: Normocephalic and atraumatic.     Right Ear: External ear normal.     Left Ear: External ear normal.     Mouth/Throat:     Pharynx: No oropharyngeal exudate or posterior oropharyngeal erythema.  Eyes:     General: No scleral icterus.       Right eye: No discharge.        Left eye: No discharge.     Conjunctiva/sclera: Conjunctivae normal.  Neck:     Thyroid : No thyromegaly.  Cardiovascular:     Rate and Rhythm: Normal rate and regular rhythm.  Pulmonary:     Effort: No respiratory distress.     Breath sounds: Normal breath sounds. No wheezing.   Abdominal:     General: Bowel sounds are normal.     Palpations: Abdomen is soft.     Tenderness: There is no abdominal tenderness.  Musculoskeletal:     Cervical back: Neck supple. No tenderness.     Comments: Increased tenderness - right mid foot. Increased tenderness to light touch. Minimal soft tissue  swelling. No ankle pain. No pain - great toe.   Lymphadenopathy:     Cervical: No cervical adenopathy.  Skin:    Findings: No erythema or rash.  Neurological:     Mental Status: She is alert.  Psychiatric:        Mood and Affect: Mood normal.        Behavior: Behavior normal.         Outpatient Encounter Medications as of 01/12/2024  Medication Sig   acetaminophen  (TYLENOL ) 325 MG tablet Take 650 mg by mouth as needed.   lisinopril  (ZESTRIL ) 20 MG tablet Take 1 tablet (20 mg total) by mouth daily.   loratadine (CLARITIN) 10 MG tablet Take 10 mg by mouth daily.   Melatonin 10 MG TABS Take 20 mg by mouth at bedtime as needed (sleep).    methylPREDNISolone  (MEDROL  DOSEPAK) 4 MG TBPK tablet Medrol  dosepak - 6 day taper. Take as directed.   Multiple Vitamin (MULTIVITAMIN WITH MINERALS) TABS tablet Take 1 tablet by mouth daily. Women's Multivitamin   [DISCONTINUED] amLODipine  (NORVASC ) 5 MG tablet Take 1 tablet (5 mg total) by mouth daily.   [DISCONTINUED] rosuvastatin  (CRESTOR ) 5 MG tablet Take 1 tablet (5 mg total) by mouth daily.   amLODipine  (NORVASC ) 5 MG tablet Take 1 tablet (5 mg total) by mouth daily.   rosuvastatin  (CRESTOR ) 5 MG tablet Take 1 tablet (5 mg total) by mouth daily.   No facility-administered encounter medications on file as of 01/12/2024.     Lab Results  Component Value Date   WBC 6.3 09/14/2023   HGB 13.3 09/14/2023   HCT 40.1 09/14/2023   PLT 381.0 09/14/2023   GLUCOSE 95 01/12/2024   CHOL 113 01/12/2024   TRIG 109.0 01/12/2024   HDL 32.50 (L) 01/12/2024   LDLDIRECT 146.7 05/03/2013   LDLCALC 59 01/12/2024   ALT 17 01/12/2024   AST 19 01/12/2024    NA 141 01/12/2024   K 4.1 01/12/2024   CL 104 01/12/2024   CREATININE 0.72 01/12/2024   BUN 16 01/12/2024   CO2 29 01/12/2024   TSH 3.45 09/14/2023    MM 3D DIAGNOSTIC MAMMOGRAM BILATERAL BREAST Result Date: 05/01/2023 CLINICAL DATA:  BI-RADS 3 follow-up of LEFT breast asymmetry, initiated September 2022. Ultrasound-guided biopsy was performed on the favored correlate September 2022 with benign results. However sonographic finding did not correlate to the mammographic finding. EXAM: DIGITAL DIAGNOSTIC BILATERAL MAMMOGRAM WITH TOMOSYNTHESIS AND CAD; ULTRASOUND LEFT BREAST LIMITED TECHNIQUE: Bilateral digital diagnostic mammography and breast tomosynthesis was performed. The images were evaluated with computer-aided detection. ; Targeted ultrasound examination of the left breast was performed. COMPARISON:  Previous exam(s). ACR Breast Density Category b: There are scattered areas of fibroglandular density. FINDINGS: Diagnostic images of the LEFT breast demonstrate resolution of a LEFT breast asymmetry. RIBBON shaped biopsy marking clip is noted in the outer breast without new adjacent suspicious findings. No suspicious mass, distortion, or microcalcifications are identified to suggest presence of malignancy. Targeted ultrasound was performed of the LEFT upper outer breast. No suspicious cystic or solid mass is seen at the site of prior mammographic concern. IMPRESSION: No mammographic evidence of malignancy bilaterally. Interval resolution of LEFT breast asymmetry. RECOMMENDATION: Screening mammogram in one year.(Code:SM-B-01Y) I have discussed the findings and recommendations with the patient. If applicable, a reminder letter will be sent to the patient regarding the next appointment. BI-RADS CATEGORY  2: Benign. Electronically Signed   By: Clancy Crimes M.D.   On: 05/01/2023 12:22   US  LIMITED  ULTRASOUND INCLUDING AXILLA LEFT BREAST  Result Date: 05/01/2023 CLINICAL DATA:  BI-RADS 3 follow-up of  LEFT breast asymmetry, initiated September 2022. Ultrasound-guided biopsy was performed on the favored correlate September 2022 with benign results. However sonographic finding did not correlate to the mammographic finding. EXAM: DIGITAL DIAGNOSTIC BILATERAL MAMMOGRAM WITH TOMOSYNTHESIS AND CAD; ULTRASOUND LEFT BREAST LIMITED TECHNIQUE: Bilateral digital diagnostic mammography and breast tomosynthesis was performed. The images were evaluated with computer-aided detection. ; Targeted ultrasound examination of the left breast was performed. COMPARISON:  Previous exam(s). ACR Breast Density Category b: There are scattered areas of fibroglandular density. FINDINGS: Diagnostic images of the LEFT breast demonstrate resolution of a LEFT breast asymmetry. RIBBON shaped biopsy marking clip is noted in the outer breast without new adjacent suspicious findings. No suspicious mass, distortion, or microcalcifications are identified to suggest presence of malignancy. Targeted ultrasound was performed of the LEFT upper outer breast. No suspicious cystic or solid mass is seen at the site of prior mammographic concern. IMPRESSION: No mammographic evidence of malignancy bilaterally. Interval resolution of LEFT breast asymmetry. RECOMMENDATION: Screening mammogram in one year.(Code:SM-B-01Y) I have discussed the findings and recommendations with the patient. If applicable, a reminder letter will be sent to the patient regarding the next appointment. BI-RADS CATEGORY  2: Benign. Electronically Signed   By: Clancy Crimes M.D.   On: 05/01/2023 12:22       Assessment & Plan:  Essential hypertension, benign Assessment & Plan: Blood pressure better as outlined.  Continue lisinopril  and amlodipine . Follow pressures. Follow metabolic panel.   Orders: -     Basic metabolic panel with GFR  Hypercholesterolemia Assessment & Plan: Low cholesterol diet and exercise.  Follow lipid panel and liver function tests. Continue crestor .  No change today.   Orders: -     Hepatic function panel -     Lipid panel  Right foot pain Assessment & Plan: Right foot pain. Persistent pain - mid (medial aspect) of foot. Tender even to light touch. Intermittent flares for at least two years. Concern regarding an acute inflammatory process (discussed gout, inflammatory arthritis). No injury or trauma and reoccurring. Treat with medrol  dose pak. Check xray and labs as outlined.   Orders: -     Uric acid -     Sedimentation rate -     DG Foot 2 Views Right; Future  Aortic atherosclerosis (HCC) Assessment & Plan: Continue crestor .    Stress Assessment & Plan: Discussed. Overall appears to be handling things relatively well. Does not feel needs further intervention at this time. Follow.    Other orders -     amLODIPine  Besylate; Take 1 tablet (5 mg total) by mouth daily.  Dispense: 90 tablet; Refill: 3 -     Rosuvastatin  Calcium ; Take 1 tablet (5 mg total) by mouth daily.  Dispense: 90 tablet; Refill: 3 -     methylPREDNISolone ; Medrol  dosepak - 6 day taper. Take as directed.  Dispense: 21 tablet; Refill: 0     Dellar Fenton, MD

## 2024-01-12 NOTE — Assessment & Plan Note (Signed)
 Right foot pain. Persistent pain - mid (medial aspect) of foot. Tender even to light touch. Intermittent flares for at least two years. Concern regarding an acute inflammatory process (discussed gout, inflammatory arthritis). No injury or trauma and reoccurring. Treat with medrol  dose pak. Check xray and labs as outlined.

## 2024-01-14 ENCOUNTER — Encounter: Payer: Self-pay | Admitting: Internal Medicine

## 2024-01-14 NOTE — Assessment & Plan Note (Signed)
 Blood pressure better as outlined.  Continue lisinopril and amlodipine. Follow pressures.  Follow metabolic panel.

## 2024-01-14 NOTE — Assessment & Plan Note (Signed)
 Low cholesterol diet and exercise.  Follow lipid panel and liver function tests. Continue crestor . No change today.

## 2024-01-14 NOTE — Assessment & Plan Note (Signed)
 Continue crestor

## 2024-01-14 NOTE — Assessment & Plan Note (Signed)
 Discussed. Overall appears to be handling things relatively well. Does not feel needs further intervention at this time. Follow.

## 2024-01-15 ENCOUNTER — Other Ambulatory Visit: Payer: Self-pay | Admitting: Internal Medicine

## 2024-01-15 DIAGNOSIS — M79671 Pain in right foot: Secondary | ICD-10-CM

## 2024-01-15 NOTE — Progress Notes (Signed)
Order placed for podiatry referral.   

## 2024-01-17 ENCOUNTER — Ambulatory Visit (INDEPENDENT_AMBULATORY_CARE_PROVIDER_SITE_OTHER): Payer: Medicare Other | Admitting: *Deleted

## 2024-01-17 VITALS — BP 109/81 | Ht 62.0 in | Wt 140.0 lb

## 2024-01-17 DIAGNOSIS — Z0000 Encounter for general adult medical examination without abnormal findings: Secondary | ICD-10-CM

## 2024-01-17 NOTE — Progress Notes (Signed)
 Subjective:   Brooke Gallagher is a 68 y.o. who presents for a Medicare Wellness preventive visit.  As a reminder, Annual Wellness Visits don't include a physical exam, and some assessments may be limited, especially if this visit is performed virtually. We may recommend an in-person follow-up visit with your provider if needed.  Visit Complete: Virtual I connected with  Brooke Gallagher on 01/17/24 by a audio enabled telemedicine application and verified that I am speaking with the correct person using two identifiers.  Patient Location: Home  Provider Location: Home Office  I discussed the limitations of evaluation and management by telemedicine. The patient expressed understanding and agreed to proceed.  Vital Signs: Because this visit was a virtual/telehealth visit, some criteria may be missing or patient reported. Any vitals not documented were not able to be obtained and vitals that have been documented are patient reported.  VideoDeclined- This patient declined Librarian, academic. Therefore the visit was completed with audio only.  Persons Participating in Visit: Patient.  AWV Questionnaire: No: Patient Medicare AWV questionnaire was not completed prior to this visit.  Cardiac Risk Factors include: advanced age (>4men, >28 women);dyslipidemia;hypertension     Objective:     Today's Vitals   01/17/24 0814  BP: 109/81  Weight: 140 lb (63.5 kg)  Height: 5\' 2"  (1.575 m)   Body mass index is 25.61 kg/m.     01/17/2024    8:27 AM 01/16/2023    8:31 AM 12/30/2021   12:45 PM 12/29/2020    8:37 AM 02/26/2020   11:27 AM 01/15/2020    1:31 PM 12/27/2019    8:42 AM  Advanced Directives  Does Patient Have a Medical Advance Directive? Yes Yes Yes Yes Yes Yes Yes  Type of Estate agent of [place];Living will Healthcare Power of [place];Living will Healthcare Power of [place];Living will Healthcare Power of [place];Living will  Healthcare Power of [place];Living will Healthcare Power of [place];Living will Healthcare Power of [place];Living will  Does patient want to make changes to medical advance directive? No - Patient declined No - Patient declined No - Patient declined No - Patient declined   No - Patient declined  Copy of Healthcare Power of Attorney in Chart? Yes - validated most recent copy scanned in chart (See row information) Yes - validated most recent copy scanned in chart (See row information) Yes - validated most recent copy scanned in chart (See row information) Yes - validated most recent copy scanned in chart (See row information)   Yes - validated most recent copy scanned in chart (See row information)    Current Medications (verified) Outpatient Encounter Medications as of 01/17/2024  Medication Sig   acetaminophen  (TYLENOL ) 325 MG tablet Take 650 mg by mouth as needed.   amLODipine  (NORVASC ) 5 MG tablet Take 1 tablet (5 mg total) by mouth daily.   lisinopril  (ZESTRIL ) 20 MG tablet Take 1 tablet (20 mg total) by mouth daily.   loratadine (CLARITIN) 10 MG tablet Take 10 mg by mouth daily.   Melatonin 10 MG TABS Take 20 mg by mouth at bedtime as needed (sleep).    Multiple Vitamin (MULTIVITAMIN WITH MINERALS) TABS tablet Take 1 tablet by mouth daily. Women's Multivitamin   rosuvastatin  (CRESTOR ) 5 MG tablet Take 1 tablet (5 mg total) by mouth daily.   methylPREDNISolone  (MEDROL  DOSEPAK) 4 MG TBPK tablet Medrol  dosepak - 6 day taper. Take as directed. (Patient not taking: Reported on 01/17/2024)   No facility-administered encounter  medications on file as of 01/17/2024.    Allergies (verified) Penicillins   History: Past Medical History:  Diagnosis Date   Allergy    Colon polyp    Depression    Herpes    Hx: UTI (urinary tract infection)    Hyperlipidemia    Hypertension    Past Surgical History:  Procedure Laterality Date   ABDOMINAL HYSTERECTOMY     BACK SURGERY  1998   lower back    BREAST BIOPSY Left 04/22/2021   US  Bx, / ribbon clip, BENIGN MAMMARY PARENCHYMA WITH MODERATE STROMAL FIBROSIS AND FOCAL FIBROADENOMATOID CHANGES   LAPAROSCOPIC BILATERAL SALPINGO OOPHERECTOMY Bilateral 01/22/2020   Procedure: LAPAROSCOPIC BILATERAL SALPINGO OOPHORECTOMY;  Surgeon: Hermine Loots, MD;  Location: [facility] ORS;  Service: Gynecology;  Laterality: Bilateral;   OOPHORECTOMY     TONSILLECTOMY  1963   Family History  Problem Relation Age of Onset   Arthritis Mother    Stroke Mother    Hypertension Mother    Alzheimer's disease Mother    Dementia Mother    Colon cancer Father    Prostate cancer Father    Mental illness Sister    Alcohol abuse Brother    Congestive Heart Failure Brother    Cancer - Lung Brother    Colon cancer Paternal Uncle    Colon cancer Maternal Grandmother    Mental illness Maternal Grandmother    Alzheimer's disease Maternal Grandmother    Hyperlipidemia Maternal Grandfather    Hypertension Maternal Grandfather    Diabetes Maternal Grandfather    Arthritis Paternal Grandmother    Alcohol abuse Paternal Grandfather    Breast cancer Neg Hx    Social History   Socioeconomic History   Marital status: Single    Spouse name: Not on file   Number of children: Not on file   Years of education: Not on file   Highest education level: Not on file  Occupational History   Not on file  Tobacco Use   Smoking status: Never   Smokeless tobacco: Never  Vaping Use   Vaping status: Never Used  Substance and Sexual Activity   Alcohol use: Yes    Alcohol/week: 1.0 - 2.0 standard drink of alcohol    Types: 1 - 2 Standard drinks or equivalent per week   Drug use: No   Sexual activity: Not Currently  Other Topics Concern   Not on file  Social History Narrative   Not on file   Social Drivers of Health   Financial Resource Strain: Low Risk  (01/17/2024)   Overall Financial Resource Strain (CARDIA)    Difficulty of Paying Living Expenses: Not hard at all   Food Insecurity: No Food Insecurity (01/17/2024)   Hunger Vital Sign    Worried About Running Out of Food in the Last Year: Never true    Ran Out of Food in the Last Year: Never true  Transportation Needs: No Transportation Needs (01/17/2024)   PRAPARE - Administrator, Civil Service (Medical): No    Lack of Transportation (Non-Medical): No  Physical Activity: Sufficiently Active (01/17/2024)   Exercise Vital Sign    Days of Exercise per Week: 5 days    Minutes of Exercise per Session: 30 min  Stress: No Stress Concern Present (01/17/2024)   [employer] of Occupational Health - Occupational Stress Questionnaire    Feeling of Stress : Only a little  Social Connections: Moderately Integrated (01/17/2024)   Social Connection and Isolation Panel [NHANES]  Frequency of Communication with Friends and Family: Three times a week    Frequency of Social Gatherings with Friends and Family: Once a week    Attends Religious Services: More than 4 times per year    Active Member of [employer] or Organizations: Yes    Attends Engineer, structural: More than 4 times per year    Marital Status: Divorced    Tobacco Counseling Counseling given: Not Answered    Clinical Intake:  Pre-visit preparation completed: Yes  Pain : No/denies pain     BMI - recorded: 25.61 Nutritional Status: BMI 25 -29 Overweight Nutritional Risks: None Diabetes: No  No results found for: "HGBA1C"   How often do you need to have someone help you when you read instructions, pamphlets, or other written materials from your doctor or pharmacy?: 1 - Never  Interpreter Needed?: No  Information entered by :: R. Keirra Zeimet LPN   Activities of Daily Living     01/17/2024    8:16 AM  In your present state of health, do you have any difficulty performing the following activities:  Hearing? 0  Vision? 0  Comment glasses  Difficulty concentrating or making decisions? 0  Walking or climbing stairs? 0   Dressing or bathing? 0  Doing errands, shopping? 0  Preparing Food and eating ? N  Using the Toilet? N  In the past six months, have you accidently leaked urine? Y  Do you have problems with loss of bowel control? N  Managing your Medications? N  Managing your Finances? N  Housekeeping or managing your Housekeeping? N    Patient Care Team: Dellar Fenton, MD as PCP - General (Internal Medicine) Rochell Chroman, RN as Oncology Nurse Navigator  I have updated your Care Teams any recent Medical Services you may have received from other providers in the past year.     Assessment:    This is a routine wellness examination for Brooke Gallagher.  Hearing/Vision screen Hearing Screening - Comments:: No issues Vision Screening - Comments:: glasses   Goals Addressed             This Visit's Progress    Patient Stated       Wants to continue to walk and walk longer       Depression Screen     01/17/2024    8:21 AM 01/12/2024    7:12 AM 01/16/2023    8:29 AM 08/31/2022    7:17 AM 04/29/2022    7:54 AM 01/19/2022    8:37 AM 12/30/2021   12:44 PM  PHQ 2/9 Scores  PHQ - 2 Score 0 0 0 0 0 0 0  PHQ- 9 Score 0  0 2 0      Fall Risk     01/17/2024    8:18 AM 01/12/2024    7:12 AM 01/16/2023    8:32 AM 08/31/2022    7:17 AM 04/29/2022    7:54 AM  Fall Risk   Falls in the past year? 0 0 0 0 0  Number falls in past yr: 0 0 0 0 0  Injury with Fall? 0 0 0 0 0  Risk for fall due to : No Fall Risks No Fall Risks No Fall Risks No Fall Risks   Follow up Falls evaluation completed;Falls prevention discussed Falls evaluation completed Falls prevention discussed;Falls evaluation completed Falls evaluation completed     MEDICARE RISK AT HOME:  Medicare Risk at Home Any stairs in or around the home?:  Yes If so, are there any without handrails?: No Home free of loose throw rugs in walkways, pet beds, electrical cords, etc?: Yes Adequate lighting in your home to reduce risk of falls?: Yes Life  alert?: No Use of a cane, walker or w/c?: No Grab bars in the bathroom?: Yes Shower chair or bench in shower?: Yes Elevated toilet seat or a handicapped toilet?: No  TIMED UP AND GO:  Was the test performed?  No  Cognitive Function: 6CIT completed        01/17/2024    8:27 AM 01/16/2023    8:36 AM 12/27/2019    8:49 AM  6CIT Screen  What Year? 0 points 0 points 0 points  What month? 0 points 0 points 0 points  What time? 0 points 0 points 0 points  Count back from 20 0 points 0 points   Months in reverse 0 points 0 points 0 points  Repeat phrase 0 points 0 points 0 points  Total Score 0 points 0 points     Immunizations Immunization History  Administered Date(s) Administered   Fluad Quad(high Dose 65+) 05/21/2019, 07/17/2021, 04/29/2022   Fluad Trivalent(High Dose 65+) 09/14/2023   Influenza-Unspecified 06/01/2020   PFIZER(Purple Top)SARS-COV-2 Vaccination 09/27/2019, 10/23/2019, 06/01/2020   Pfizer Covid-19 Vaccine Bivalent Booster 27yrs & up 07/03/2021   Pneumococcal Conjugate-13 06/13/2019   Pneumococcal Polysaccharide-23 08/25/2021   Tdap 03/05/2020    Screening Tests Health Maintenance  Topic Date Due   Zoster Vaccines- Shingrix (1 of 2) Never done   Medicare Annual Wellness (AWV)  01/16/2024   COVID-19 Vaccine (5 - 2024-25 season) 01/28/2024 (Originally 04/16/2023)   MAMMOGRAM  04/30/2024   Colonoscopy  07/28/2024   DTaP/Tdap/Td (2 - Td or Tdap) 03/05/2030   Pneumonia Vaccine 33+ Years old  Completed   DEXA SCAN  Completed   Hepatitis C Screening  Completed   HPV VACCINES  Aged Out   Meningococcal B Vaccine  Aged Out   INFLUENZA VACCINE  Discontinued    Health Maintenance  Health Maintenance Due  Topic Date Due   Zoster Vaccines- Shingrix (1 of 2) Never done   Medicare Annual Wellness (AWV)  01/16/2024   Health Maintenance Items Addressed: Discussed the need to update shingles vaccines.  Additional Screening:  Vision Screening: Recommended annual  ophthalmology exams for early detection of glaucoma and other disorders of the eye. Up to date [facility] Would you like a referral to an eye doctor? No    Dental Screening: Recommended annual dental exams for proper oral hygiene  Community Resource Referral / Chronic Care Management: CRR required this visit?  No   CCM required this visit?  No   Plan:    I have personally reviewed and noted the following in the patient's chart:   Medical and social history Use of alcohol, tobacco or illicit drugs  Current medications and supplements including opioid prescriptions. Patient is not currently taking opioid prescriptions. Functional ability and status Nutritional status Physical activity Advanced directives List of other physicians Hospitalizations, surgeries, and ER visits in previous 12 months Vitals Screenings to include cognitive, depression, and falls Referrals and appointments  In addition, I have reviewed and discussed with patient certain preventive protocols, quality metrics, and best practice recommendations. A written personalized care plan for preventive services as well as general preventive health recommendations were provided to patient.   Felicitas Horse, LPN   4/0/9811   After Visit Summary: (MyChart) Due to this being a telephonic visit, the after visit summary  with patients personalized plan was offered to patient via MyChart   Notes: Nothing significant to report at this time.

## 2024-01-17 NOTE — Patient Instructions (Signed)
 Ms. Tool , Thank you for taking time out of your busy schedule to complete your Annual Wellness Visit with me. I enjoyed our conversation and look forward to speaking with you again next year. I, as well as your care team,  appreciate your ongoing commitment to your health goals. Please review the following plan we discussed and let me know if I can assist you in the future. Your Game plan/ To Do List    Referrals: If you haven't heard from the office you've been referred to, please reach out to them at the phone provided.  Remember to update your shingles vaccines. Follow up Visits: Next Medicare AWV with our clinical staff: 01/21/25 @ 8:10   Have you seen your provider in the last 6 months (3 months if uncontrolled diabetes)? Yes Next Office Visit with your provider: 09/16/24  Clinician Recommendations:  Aim for 30 minutes of exercise or brisk walking, 6-8 glasses of water, and 5 servings of fruits and vegetables each day.       This is a list of the screening recommended for you and due dates:  Health Maintenance  Topic Date Due   Zoster (Shingles) Vaccine (1 of 2) Never done   COVID-19 Vaccine (5 - 2024-25 season) 01/28/2024*   Mammogram  04/30/2024   Colon Cancer Screening  07/28/2024   Medicare Annual Wellness Visit  01/16/2025   DTaP/Tdap/Td vaccine (2 - Td or Tdap) 03/05/2030   Pneumonia Vaccine  Completed   DEXA scan (bone density measurement)  Completed   Hepatitis C Screening  Completed   HPV Vaccine  Aged Out   Meningitis B Vaccine  Aged Out   Flu Shot  Discontinued  *Topic was postponed. The date shown is not the original due date.    Advanced directives: (In Chart) A copy of your advanced directives are scanned into your chart should your provider ever need it. Advance Care Planning is important because it:  [x]  Makes sure you receive the medical care that is consistent with your values, goals, and preferences  [x]  It provides guidance to your family and loved ones and  reduces their decisional burden about whether or not they are making the right decisions based on your wishes.

## 2024-02-27 ENCOUNTER — Ambulatory Visit: Payer: Self-pay

## 2024-02-27 ENCOUNTER — Ambulatory Visit: Admitting: Nurse Practitioner

## 2024-02-27 VITALS — BP 118/70 | HR 74 | Temp 97.7°F | Ht 62.0 in | Wt 142.4 lb

## 2024-02-27 DIAGNOSIS — L03115 Cellulitis of right lower limb: Secondary | ICD-10-CM

## 2024-02-27 DIAGNOSIS — T63481A Toxic effect of venom of other arthropod, accidental (unintentional), initial encounter: Secondary | ICD-10-CM

## 2024-02-27 MED ORDER — DOXYCYCLINE HYCLATE 100 MG PO TABS: 100.0000 mg | ORAL_TABLET | Freq: Two times a day (BID) | ORAL | 0 refills | Status: DC

## 2024-02-27 NOTE — Progress Notes (Signed)
 Brooke Glance, NP-C Phone: [phone]  Brooke Gallagher is a 68 y.o. female who presents today for insect sting.   Discussed the use of AI scribe software for clinical note transcription with the patient, who gave verbal consent to proceed.  History of Present Illness   Blue Ruggerio is a 68 year old female who presents with a bee sting on her right foot.  She was stung by yellow jackets on her right foot while mowing the lawn on Saturday. She experiences intermittent shooting pain from the sting site. The area is swollen and causes discomfort and pain upon contact.  She has been taking over-the-counter generic Benadryl, initially during the day and at night, but now only at night. Ice and cold packs are used intermittently to manage swelling, although prolonged use of ice increases discomfort. She has been elevating her foot as well.  In the past, she experienced a similar incident last year where she was stung on both feet, resulting in significant swelling that prevented her from wearing shoes for a week. During that episode, she did not seek medical attention and managed the symptoms with rest and elevation until they resolved.  No difficulty breathing, swelling of the face, lips, or mouth, or systemic allergic reactions. She has a known allergy to penicillins, which causes a rash but no respiratory symptoms.      Social History   Tobacco Use  Smoking Status Never  Smokeless Tobacco Never    Current Outpatient Medications on File Prior to Visit  Medication Sig Dispense Refill   acetaminophen  (TYLENOL ) 325 MG tablet Take 650 mg by mouth as needed.     amLODipine  (NORVASC ) 5 MG tablet Take 1 tablet (5 mg total) by mouth daily. 90 tablet 3   lisinopril  (ZESTRIL ) 20 MG tablet Take 1 tablet (20 mg total) by mouth daily. 90 tablet 3   loratadine (CLARITIN) 10 MG tablet Take 10 mg by mouth daily.     Melatonin 10 MG TABS Take 20 mg by mouth at bedtime as needed (sleep).       Multiple Vitamin (MULTIVITAMIN WITH MINERALS) TABS tablet Take 1 tablet by mouth daily. Women's Multivitamin     rosuvastatin  (CRESTOR ) 5 MG tablet Take 1 tablet (5 mg total) by mouth daily. 90 tablet 3   methylPREDNISolone  (MEDROL  DOSEPAK) 4 MG TBPK tablet Medrol  dosepak - 6 day taper. Take as directed. (Patient not taking: Reported on 01/17/2024) 21 tablet 0   No current facility-administered medications on file prior to visit.     ROS see history of present illness  Objective  Physical Exam Vitals:   02/27/24 1058  BP: 118/70  Pulse: 74  Temp: 97.7 F (36.5 C)  SpO2: 97%    BP Readings from Last 3 Encounters:  02/27/24 118/70  01/17/24 109/81  01/12/24 124/78   Wt Readings from Last 3 Encounters:  02/27/24 142 lb 6.4 oz (64.6 kg)  01/17/24 140 lb (63.5 kg)  01/12/24 141 lb 6.4 oz (64.1 kg)    Physical Exam Constitutional:      General: She is not in acute distress.    Appearance: Normal appearance.  HENT:     Head: Normocephalic.  Cardiovascular:     Rate and Rhythm: Normal rate and regular rhythm.     Heart sounds: Normal heart sounds.  Pulmonary:     Effort: Pulmonary effort is normal.     Breath sounds: Normal breath sounds.  Musculoskeletal:     Right ankle: Swelling present. Tenderness  present. Decreased range of motion.     Right foot: Decreased range of motion. Swelling and tenderness present.     Comments: Erythema, see pic below  Skin:    General: Skin is warm and dry.  Neurological:     General: No focal deficit present.     Mental Status: She is alert.  Psychiatric:        Mood and Affect: Mood normal.        Behavior: Behavior normal.      Assessment/Plan: Please see individual problem list.  Cellulitis of right lower extremity Assessment & Plan: Cellulitis on the right foot followed a yellow jacket sting, presenting as a localized infection without anaphylaxis. Start doxycycline  100 mg twice daily. Advise ice application and elevation of  the affected foot. Recommend acetaminophen  or ibuprofen  for pain management. Continue antihistamine therapy with diphenhydramine at bedtime or cetirizine in the morning. Instruct her to monitor for signs of worsening infection, such as spreading erythema, increased swelling, or severe pain, and to seek medical attention if these occur.  Orders: -     Doxycycline  Hyclate; Take 1 tablet (100 mg total) by mouth 2 (two) times daily.  Dispense: 14 tablet; Refill: 0  Insect stings, accidental or unintentional, initial encounter Assessment & Plan: She experiences significant reactions to yellow jacket stings with potential for severe allergic reactions, though no current anaphylactic symptoms are present. Discussed the unpredictability of allergic reactions and the importance of monitoring for severe symptoms. Advise avoidance of yellow jackets and bees. Recommend insect control in the yard to prevent future stings. Educate on signs of anaphylaxis, such as dyspnea, emesis, or orofacial swelling, and advise seeking emergency care if these occur.       Return if symptoms worsen or fail to improve.   Brooke Glance, NP-C [facility]

## 2024-02-27 NOTE — Telephone Encounter (Signed)
 FYI Only or Action Required?: FYI only for provider.  Patient was last seen in primary care on 01/12/2024 by Glendia Shad, MD.  Called Nurse Triage reporting Insect Bite.  Symptoms began several days ago.  Interventions attempted: Nothing.  Symptoms are: unchanged.  Triage Disposition: See Physician Within 24 Hours  Patient/caregiver understands and will follow disposition?: Yes     Copied from CRM [id number]. Topic: Clinical - Red Word Triage >> Feb 27, 2024  8:14 AM Revonda D wrote: Red Word that prompted transfer to Nurse Triage: Swollen, worsen symptoms  Pt stated that she was stung on her right foot by a yellow jacket on Saturday and is now experiencing worsen symptoms. Pt stated that he foot is swollen and has discoloration. Reason for Disposition  [1] Red or very tender (to touch) area AND [2] started over 24 hours after the sting  Answer Assessment - Initial Assessment Questions 1. TYPE: What type of sting was it? (e.g., bee, yellow jacket, unknown)      Yellow jacket 2. ONSET: When did it occur?      Saturday 3. LOCATION: Where is the sting located?  How many stings?     Right foot 4. SWELLING SIZE: How big is the swelling? (e.g., inches or cm)     Severe, whole foot and portion of leg 5. REDNESS: Is the area red or pink? If Yes, ask: What size is area of redness? (e.g., inches or cm). When did the redness start?     yes 6. PAIN: Is there any pain? If Yes, ask: How bad is it?  (Scale 0-10; or none, mild, moderate, severe)     moderate 7. ITCHING: Is there any itching? If Yes, ask: How bad is it?      yes 8. RESPIRATORY DISTRESS: Describe your breathing.     no 9. PRIOR REACTIONS: Have you had any severe allergic reactions to stings in the past? If Yes, ask: What happened?     Last year, no 10. OTHER SYMPTOMS: Do you have any other symptoms? (e.g., abdomen pain, face or tongue swelling, new rash elsewhere, vomiting)       no 11.  PREGNANCY: Is there any chance you are pregnant? When was your last menstrual period?       na  Protocols used: Bee or Yellow Jacket Sting-A-AH

## 2024-02-27 NOTE — Telephone Encounter (Signed)
 FYI

## 2024-03-04 ENCOUNTER — Encounter: Payer: Self-pay | Admitting: Nurse Practitioner

## 2024-03-04 NOTE — Assessment & Plan Note (Signed)
 Cellulitis on the right foot followed a yellow jacket sting, presenting as a localized infection without anaphylaxis. Start doxycycline  100 mg twice daily. Advise ice application and elevation of the affected foot. Recommend acetaminophen  or ibuprofen  for pain management. Continue antihistamine therapy with diphenhydramine at bedtime or cetirizine in the morning. Instruct her to monitor for signs of worsening infection, such as spreading erythema, increased swelling, or severe pain, and to seek medical attention if these occur.

## 2024-03-04 NOTE — Assessment & Plan Note (Signed)
 She experiences significant reactions to yellow jacket stings with potential for severe allergic reactions, though no current anaphylactic symptoms are present. Discussed the unpredictability of allergic reactions and the importance of monitoring for severe symptoms. Advise avoidance of yellow jackets and bees. Recommend insect control in the yard to prevent future stings. Educate on signs of anaphylaxis, such as dyspnea, emesis, or orofacial swelling, and advise seeking emergency care if these occur.

## 2024-04-04 ENCOUNTER — Other Ambulatory Visit: Payer: Self-pay | Admitting: Internal Medicine

## 2024-04-04 DIAGNOSIS — Z1231 Encounter for screening mammogram for malignant neoplasm of breast: Secondary | ICD-10-CM

## 2024-04-08 ENCOUNTER — Ambulatory Visit
Admission: EM | Admit: 2024-04-08 | Discharge: 2024-04-08 | Disposition: A | Discharge status: Home / Self Care | Attending: Emergency Medicine | Admitting: Emergency Medicine

## 2024-04-08 ENCOUNTER — Encounter: Payer: Self-pay | Admitting: Emergency Medicine

## 2024-04-08 ENCOUNTER — Ambulatory Visit: Payer: Self-pay

## 2024-04-08 DIAGNOSIS — R21 Rash and other nonspecific skin eruption: Secondary | ICD-10-CM | POA: Diagnosis not present

## 2024-04-08 MED ORDER — HYDROXYZINE HCL 25 MG PO TABS: 25.0000 mg | ORAL_TABLET | Freq: Four times a day (QID) | ORAL | 0 refills | Status: DC

## 2024-04-08 MED ORDER — METHYLPREDNISOLONE ACETATE 80 MG/ML IJ SUSP
60.0000 mg | Freq: Once | INTRAMUSCULAR | Status: AC
  Administered 2024-04-08: 60 mg via INTRAMUSCULAR

## 2024-04-08 NOTE — Telephone Encounter (Signed)
 Rash that is all over the left side of her body. very itchy and painful. Started Saturday. Getting worse. She has asked that I cancel appt for tomorrow. She is going to urgent care today to be seen.

## 2024-04-08 NOTE — ED Provider Notes (Signed)
 Brooke Gallagher    CSN: [id number] Arrival date & time: 04/08/24  1320      History   Chief Complaint Chief Complaint  Patient presents with   Rash    HPI Brooke Gallagher is a 68 y.o. female.   Patient presents for evaluation of a blistering erythematous pruritic rash present to the bilateral upper extremities, the left side of the neck and chest and the left lower leg beginning 2 days ago.  First noted to the right arm today.  Progressively worsening and spreading.  Denies drainage but endorses pain.  Has not attempted treatment.  No known sick contact.  Denies changes in toiletries, diet or medication, endorses recent yardwork.  Past Medical History:  Diagnosis Date   Allergy    Colon polyp    Depression    Herpes    Hx: UTI (urinary tract infection)    Hyperlipidemia    Hypertension     Patient Active Problem List   Diagnosis Date Noted   Cellulitis of right lower extremity 02/27/2024   Insect sting 02/27/2024   Right foot pain 01/12/2024   Urinary leakage 09/14/2023   Skin lesion 02/12/2023   Family history of heart disease 02/12/2023   Swelling of right foot 10/03/2022   Neuropathy 08/31/2022   Nocturia 04/30/2022   Pain of right heel 01/20/2022   Right leg pain 01/19/2022   Arthritis of right knee 01/19/2022   Pain of right calf 01/19/2022   Abnormal mammogram 07/14/2021   Red eye 03/29/2021   Cough 03/29/2021   Aortic atherosclerosis (HCC) 03/29/2021   Retinal edema 11/08/2020   Grief reaction 10/09/2020   Adnexal mass 01/15/2020   Elevated blood pressure reading 12/18/2019   Abdominal fullness 12/18/2019   Stress 06/16/2019   Diverticulosis of colon 10/13/2016   Healthcare maintenance 06/23/2016   Weight gain 02/10/2016   Essential hypertension, benign 03/02/2013   Hypercholesterolemia 03/02/2013   Environmental allergies 03/02/2013   Hypertension 07/19/2011    Past Surgical History:  Procedure Laterality Date   ABDOMINAL HYSTERECTOMY      BACK SURGERY  1998   lower back   BREAST BIOPSY Left 04/22/2021   US  Bx, / ribbon clip, BENIGN MAMMARY PARENCHYMA WITH MODERATE STROMAL FIBROSIS AND FOCAL FIBROADENOMATOID CHANGES   LAPAROSCOPIC BILATERAL SALPINGO OOPHERECTOMY Bilateral 01/22/2020   Procedure: LAPAROSCOPIC BILATERAL SALPINGO OOPHORECTOMY;  Surgeon: Mancil Barter, MD;  Location: [facility] ORS;  Service: Gynecology;  Laterality: Bilateral;   OOPHORECTOMY     TONSILLECTOMY  1963    OB History     Gravida  2   Para      Term      Preterm      AB      Living  2      SAB      IAB      Ectopic      Multiple      Live Births  2            Home Medications    Prior to Admission medications   Medication Sig Start Date End Date Taking? Authorizing Provider  hydrOXYzine  (ATARAX ) 25 MG tablet Take 1 tablet (25 mg total) by mouth every 6 (six) hours. 04/08/24  Yes Tocarra Gassen R, NP  acetaminophen  (TYLENOL ) 325 MG tablet Take 650 mg by mouth as needed.    [provider]  amLODipine  (NORVASC ) 5 MG tablet Take 1 tablet (5 mg total) by mouth daily. 01/12/24   Glendia Shad, MD  doxycycline  (VIBRA -TABS) 100 MG tablet Take 1 tablet (100 mg total) by mouth 2 (two) times daily. 02/27/24   Gretel App, NP  lisinopril  (ZESTRIL ) 20 MG tablet Take 1 tablet (20 mg total) by mouth daily. 08/15/23   Glendia Shad, MD  loratadine (CLARITIN) 10 MG tablet Take 10 mg by mouth daily.    [provider]  Melatonin 10 MG TABS Take 20 mg by mouth at bedtime as needed (sleep).     [provider]  methylPREDNISolone  (MEDROL  DOSEPAK) 4 MG TBPK tablet Medrol  dosepak - 6 day taper. Take as directed. Patient not taking: Reported on 01/17/2024 01/12/24   Glendia Shad, MD  Multiple Vitamin (MULTIVITAMIN WITH MINERALS) TABS tablet Take 1 tablet by mouth daily. Women's Multivitamin    [provider]  rosuvastatin  (CRESTOR ) 5 MG tablet Take 1 tablet (5 mg total) by mouth daily. 01/12/24   Glendia Shad, MD    Family History Family History  Problem Relation Age of Onset   Arthritis Mother    Stroke Mother    Hypertension Mother    Alzheimer's disease Mother    Dementia Mother    Colon cancer Father    Prostate cancer Father    Mental illness Sister    Alcohol abuse Brother    Congestive Heart Failure Brother    Cancer - Lung Brother    Colon cancer Paternal Uncle    Colon cancer Maternal Grandmother    Mental illness Maternal Grandmother    Alzheimer's disease Maternal Grandmother    Hyperlipidemia Maternal Grandfather    Hypertension Maternal Grandfather    Diabetes Maternal Grandfather    Arthritis Paternal Grandmother    Alcohol abuse Paternal Grandfather    Breast cancer Neg Hx     Social History Social History   Tobacco Use   Smoking status: Never   Smokeless tobacco: Never  Vaping Use   Vaping status: Never Used  Substance Use Topics   Alcohol use: Yes    Alcohol/week: 1.0 - 2.0 standard drink of alcohol    Types: 1 - 2 Standard drinks or equivalent per week   Drug use: No     Allergies   Penicillins   Review of Systems Review of Systems  Skin:  Positive for rash.     Physical Exam Triage Vital Signs ED Triage Vitals  Encounter Vitals Group     BP 04/08/24 1346 131/78     Girls Systolic BP Percentile --      Girls Diastolic BP Percentile --      Boys Systolic BP Percentile --      Boys Diastolic BP Percentile --      Pulse Rate 04/08/24 1346 70     Resp 04/08/24 1346 18     Temp 04/08/24 1346 98.7 F (37.1 C)     Temp Source 04/08/24 1346 Oral     SpO2 04/08/24 1346 97 %     Weight --      Height --      Head Circumference --      Peak Flow --      Pain Score 04/08/24 1342 10     Pain Loc --      Pain Education --      Exclude from Growth Chart --    No data found.  Updated Vital Signs BP 131/78 (BP Location: Left Arm)   Pulse 70   Temp 98.7 F (37.1 C) (Oral)   Resp 18   SpO2 97%  Visual Acuity Right Eye  Distance:   Left Eye Distance:   Bilateral Distance:    Right Eye Near:   Left Eye Near:    Bilateral Near:     Physical Exam Constitutional:      Appearance: Normal appearance.  Eyes:     Extraocular Movements: Extraocular movements intact.  Pulmonary:     Effort: Pulmonary effort is normal.  Skin:    Comments: Erythematous blistering rash present to the bilateral upper extremities, chest, left side of neck and the left lower extremity  Neurological:     Mental Status: She is alert and oriented to person, place, and time. Mental status is at baseline.      UC Treatments / Results  Labs (all labs ordered are listed, but only abnormal results are displayed) Labs Reviewed - No data to display  EKG   Radiology No results found.  Procedures Procedures (including critical care time)  Medications Ordered in UC Medications  methylPREDNISolone  acetate (DEPO-MEDROL ) injection 60 mg (60 mg Intramuscular Given 04/08/24 1408)    Initial Impression / Assessment and Plan / UC Course  I have reviewed the triage vital signs and the nursing notes.  Pertinent labs & imaging results that were available during my care of the patient were reviewed by me and considered in my medical decision making (see chart for details).  Rash  Presentation is consistent with a poison ivy/oak dermatitis, rash is occurring bilaterally therefore low suspicion for shingles as the etiology, discussed this with patient, attempted to capture photo but did not save on haiku, methylprednisolone  injection given, has old prescription of prednisone at home that she will initiate tomorrow, additionally prescribed hydroxyzine  for management of pruritus, recommended over-the-counter medications and nonpharmacological supportive care and advised follow-up if symptoms persist or with Final Clinical Impressions(s) / UC Diagnoses   Final diagnoses:  Rash and nonspecific skin eruption     Discharge Instructions       Today you are being treated for the rash which appears to be inflammatory, most concerning for poison ivy or oak, it does not appear to be infectious  Rash is not consistent with shingles as it is occurring on both sides of your body and shingles is always unilateral  You have been given an injection of steroids today in the office today to help reduce the inflammatory process that occurs with this rash which will help minimize your itching as well as begin to clear  Starting tomorrow take prednisone every morning with food as directed, to continue the above process  You may use hydroxyzine  every 6 hours as needed for itching  You may continue use of topical calamine or Benadryl cream to help manage itching  Please avoid long exposures to heat such as a hot steamy shower or being outside as this may cause further irritation to your rash  You may follow-up with his urgent care as needed if symptoms persist or worsen    ED Prescriptions     Medication Sig Dispense Auth. Provider   hydrOXYzine  (ATARAX ) 25 MG tablet Take 1 tablet (25 mg total) by mouth every 6 (six) hours. 12 tablet Charlestine Rookstool R, NP      PDMP not reviewed this encounter.   Philisha Shelba SAUNDERS, NP 04/08/24 [phone]

## 2024-04-08 NOTE — Discharge Instructions (Signed)
 Today you are being treated for the rash which appears to be inflammatory, most concerning for poison ivy or oak, it does not appear to be infectious  Rash is not consistent with shingles as it is occurring on both sides of your body and shingles is always unilateral  You have been given an injection of steroids today in the office today to help reduce the inflammatory process that occurs with this rash which will help minimize your itching as well as begin to clear  Starting tomorrow take prednisone every morning with food as directed, to continue the above process  You may use hydroxyzine  every 6 hours as needed for itching  You may continue use of topical calamine or Benadryl cream to help manage itching  Please avoid long exposures to heat such as a hot steamy shower or being outside as this may cause further irritation to your rash  You may follow-up with his urgent care as needed if symptoms persist or worsen

## 2024-04-08 NOTE — ED Triage Notes (Signed)
 Patient reports red itchy painful rash all over body that started 2 days ago. Patient stated she was using cold compress on rash and OTC allergy medication but cannot recall name.  Rates pain 10/10.

## 2024-04-08 NOTE — Telephone Encounter (Signed)
 FYI Only or Action Required?: FYI only for provider.  Patient was last seen in primary care on 02/27/2024 by Gretel App, NP.  Called Nurse Triage reporting Rash.  Symptoms began several days ago.  Interventions attempted: OTC medications: Allergy medication and Ice/heat application.  Symptoms are: gradually worsening.  Triage Disposition: See PCP When Office is Open (Within 3 Days)  Patient/caregiver understands and will follow disposition?: Yes   Copied from CRM #[id number]. Topic: Clinical - Red Word Triage >> Apr 08, 2024 10:01 AM Drema MATSU wrote: Red Word that prompted transfer to Nurse Triage: Patient has developed shingles over the weekend. She has rash all over her chest, neck, and arms. She said that it very itchy painful Reason for Disposition  Mild widespread rash  (Exception: Heat rash lasting 3 days or less.)  Answer Assessment - Initial Assessment Questions Pt states she believes she has shingles. Currently using a wet rag on area and taking an otc allergy medication for symptoms.    1. APPEARANCE of RASH: What does the rash look like? (e.g., blisters, dry flaky skin, red spots, redness, sores)     Blisters  2. SIZE: How big are the spots? (e.g., tip of pen, eraser, coin; inches, centimeters)     Varies in size, pt states she is unsure.  3. LOCATION: Where is the rash located?     Chest, neck, L leg and arms.  4. COLOR: What color is the rash? (Note: It is difficult to assess rash color in people with darker-colored skin. When this situation occurs, simply ask the caller to describe what they see.)     Red in color.  5. ONSET: When did the rash begin?     Saturday  6. FEVER: Do you have a fever? If Yes, ask: What is your temperature, how was it measured, and when did it start?     Did not take temp but feeling weak.  7. ITCHING: Does the rash itch? If Yes, ask: How bad is the itch? (Scale 1-10; or mild, moderate, severe)     Severe  8. CAUSE:  What do you think is causing the rash?     Shingles  9. MEDICINE FACTORS: Have you started any new medicines within the last 2 weeks? (e.g., antibiotics)      No  10. OTHER SYMPTOMS: Do you have any other symptoms? (e.g., dizziness, headache, sore throat, joint pain)       Generalized weakness  Protocols used: Rash or Redness - [facility]

## 2024-04-09 ENCOUNTER — Ambulatory Visit: Admitting: Family

## 2024-05-02 ENCOUNTER — Encounter

## 2024-05-24 ENCOUNTER — Ambulatory Visit
Admission: RE | Admit: 2024-05-24 | Discharge: 2024-05-24 | Disposition: A | Discharge status: Home / Self Care | Source: Ambulatory Visit | Attending: Internal Medicine | Admitting: Internal Medicine

## 2024-05-24 DIAGNOSIS — Z1231 Encounter for screening mammogram for malignant neoplasm of breast: Secondary | ICD-10-CM | POA: Diagnosis present

## 2024-05-29 ENCOUNTER — Telehealth: Payer: Self-pay

## 2024-05-29 NOTE — Telephone Encounter (Signed)
 Copied from CRM [id number]. Topic: General - Other >> May 29, 2024  3:44 PM Rosina BIRCH wrote: Reason for CRM: patient called stating she would like to get her mammogram results and she can not access it through her mychart  [phone]

## 2024-05-30 NOTE — Telephone Encounter (Signed)
 Please call and notify mammogram is ok.

## 2024-05-30 NOTE — Telephone Encounter (Signed)
 Patient notified and made aware of recent imaging results. Patient verbalized understanding.

## 2024-08-02 ENCOUNTER — Other Ambulatory Visit: Payer: Self-pay | Admitting: Internal Medicine

## 2024-08-28 NOTE — Discharge Instructions (Signed)

## 2024-08-29 ENCOUNTER — Encounter: Admission: RE | Disposition: A | Discharge status: Home / Self Care | Payer: Self-pay | Source: Home / Self Care

## 2024-08-29 ENCOUNTER — Other Ambulatory Visit: Payer: Self-pay

## 2024-08-29 ENCOUNTER — Ambulatory Visit: Admission: RE | Admit: 2024-08-29 | Discharge: 2024-08-29 | Disposition: A | Discharge status: Home / Self Care

## 2024-08-29 ENCOUNTER — Ambulatory Visit: Payer: Self-pay | Admitting: Anesthesiology

## 2024-08-29 ENCOUNTER — Encounter: Payer: Self-pay | Admitting: Anesthesiology

## 2024-08-29 DIAGNOSIS — I10 Essential (primary) hypertension: Secondary | ICD-10-CM | POA: Insufficient documentation

## 2024-08-29 DIAGNOSIS — H2512 Age-related nuclear cataract, left eye: Secondary | ICD-10-CM | POA: Insufficient documentation

## 2024-08-29 HISTORY — PX: CATARACT EXTRACTION W/PHACO: SHX586

## 2024-08-29 MED ORDER — CYCLOPENTOLATE HCL 2 % OP SOLN
1.0000 [drp] | OPHTHALMIC | Status: AC | PRN
  Administered 2024-08-29 (×3): 1 [drp] via OPHTHALMIC

## 2024-08-29 MED ORDER — FENTANYL CITRATE (PF) 100 MCG/2ML IJ SOLN
INTRAMUSCULAR | Status: AC
  Filled 2024-08-29: qty 2

## 2024-08-29 MED ORDER — MIDAZOLAM HCL 2 MG/2ML IJ SOLN
INTRAMUSCULAR | Status: AC
  Filled 2024-08-29: qty 2

## 2024-08-29 MED ORDER — LIDOCAINE HCL (PF) 2 % IJ SOLN
INTRAOCULAR | Status: DC | PRN
  Administered 2024-08-29: 1 mL via INTRAOCULAR

## 2024-08-29 MED ORDER — SIGHTPATH DOSE#1 NA HYALUR & NA CHOND-NA HYALUR IO KIT
PACK | INTRAOCULAR | Status: DC | PRN
  Administered 2024-08-29: 1 via OPHTHALMIC

## 2024-08-29 MED ORDER — SIGHTPATH DOSE#1 BSS IO SOLN
INTRAOCULAR | Status: DC | PRN
  Administered 2024-08-29: 15 mL via INTRAOCULAR

## 2024-08-29 MED ORDER — SIGHTPATH DOSE#1 BSS IO SOLN
INTRAOCULAR | Status: DC | PRN
  Administered 2024-08-29: 107 mL via OPHTHALMIC

## 2024-08-29 MED ORDER — MOXIFLOXACIN HCL 0.5 % OP SOLN
OPHTHALMIC | Status: DC | PRN
  Administered 2024-08-29: .2 mL via OPHTHALMIC

## 2024-08-29 MED ORDER — TETRACAINE HCL 0.5 % OP SOLN
OPHTHALMIC | Status: AC
  Filled 2024-08-29: qty 4

## 2024-08-29 MED ORDER — PHENYLEPHRINE HCL 10 % OP SOLN
OPHTHALMIC | Status: AC
  Filled 2024-08-29: qty 5

## 2024-08-29 MED ORDER — CYCLOPENTOLATE HCL 2 % OP SOLN
OPHTHALMIC | Status: AC
  Filled 2024-08-29: qty 2

## 2024-08-29 MED ORDER — MIDAZOLAM HCL (PF) 2 MG/2ML IJ SOLN
INTRAMUSCULAR | Status: DC | PRN
  Administered 2024-08-29: 2 mg via INTRAVENOUS

## 2024-08-29 MED ORDER — BRIMONIDINE TARTRATE-TIMOLOL 0.2-0.5 % OP SOLN
OPHTHALMIC | Status: DC | PRN
  Administered 2024-08-29: 1 [drp] via OPHTHALMIC

## 2024-08-29 MED ORDER — TETRACAINE HCL 0.5 % OP SOLN
1.0000 [drp] | OPHTHALMIC | Status: DC | PRN
  Administered 2024-08-29 (×3): 1 [drp] via OPHTHALMIC

## 2024-08-29 MED ORDER — FENTANYL CITRATE (PF) 100 MCG/2ML IJ SOLN
INTRAMUSCULAR | Status: DC | PRN
  Administered 2024-08-29: 25 ug via INTRAVENOUS

## 2024-08-29 MED ORDER — PHENYLEPHRINE HCL 10 % OP SOLN
1.0000 [drp] | OPHTHALMIC | Status: AC | PRN
  Administered 2024-08-29 (×3): 1 [drp] via OPHTHALMIC

## 2024-08-29 NOTE — Anesthesia Postprocedure Evaluation (Signed)
"   Anesthesia Post Note  Patient: Brooke Gallagher  Procedure(s) Performed: PHACOEMULSIFICATION, CATARACT, WITH IOL INSERTION 11.03, 01:14.6 (Left)  Patient location during evaluation: PACU Anesthesia Type: MAC Level of consciousness: awake and alert Pain management: pain level controlled Vital Signs Assessment: post-procedure vital signs reviewed and stable Respiratory status: spontaneous breathing, nonlabored ventilation and respiratory function stable Cardiovascular status: stable and blood pressure returned to baseline Postop Assessment: no apparent nausea or vomiting Anesthetic complications: no   No notable events documented.   Last Vitals:  Vitals:   08/29/24 0922 08/29/24 0925  BP:  (!) 143/81  Pulse: 68 65  Resp: 15 13  Temp: 36.8 C   SpO2: 98% 96%    Last Pain:  Vitals:   08/29/24 0925  TempSrc:   PainSc: 0-No pain                 [name]      "

## 2024-08-29 NOTE — H&P (Signed)
 [facility]   Primary Care Physician:  Glendia Shad, MD Ophthalmologist: Dr. Curtistine Fava  Pre-Procedure History & Physical: HPI:  Brooke Gallagher is a 69 y.o. female here for cataract surgery.   Past Medical History:  Diagnosis Date   Allergy    Colon polyp    Herpes    Hx: UTI (urinary tract infection)    Hyperlipidemia    Hypertension     Past Surgical History:  Procedure Laterality Date   ABDOMINAL HYSTERECTOMY     BACK SURGERY  1998   lower back   BREAST BIOPSY Left 04/22/2021   US  Bx, / ribbon clip, BENIGN MAMMARY PARENCHYMA WITH MODERATE STROMAL FIBROSIS AND FOCAL FIBROADENOMATOID CHANGES   LAPAROSCOPIC BILATERAL SALPINGO OOPHERECTOMY Bilateral 01/22/2020   Procedure: LAPAROSCOPIC BILATERAL SALPINGO OOPHORECTOMY;  Surgeon: Mancil Barter, MD;  Location: [facility] ORS;  Service: Gynecology;  Laterality: Bilateral;   OOPHORECTOMY     TONSILLECTOMY  1963    Prior to Admission medications  Medication Sig Start Date End Date Taking? Authorizing Provider  acetaminophen  (TYLENOL ) 325 MG tablet Take 650 mg by mouth as needed.   Yes [provider]  amLODipine  (NORVASC ) 5 MG tablet Take 1 tablet (5 mg total) by mouth daily. 01/12/24  Yes Glendia Shad, MD  calcium  carbonate (TUMS EX) 750 MG chewable tablet Chew 1 tablet by mouth daily after supper.   Yes [provider]  FIBER ADULT GUMMIES PO Take 2 tablets by mouth daily.   Yes [provider]  lisinopril  (ZESTRIL ) 20 MG tablet Take 1 tablet by mouth once daily 08/02/24  Yes Glendia Shad, MD  loratadine (CLARITIN) 10 MG tablet Take 10 mg by mouth daily.   Yes [provider]  Melatonin 10 MG TABS Take 20 mg by mouth at bedtime as needed (sleep).    Yes [provider]  Multiple Vitamin (MULTIVITAMIN WITH MINERALS) TABS tablet Take 1 tablet by mouth daily. Women's Multivitamin   Yes [provider]  NON FORMULARY Take 8 oz by mouth daily. Green Tea with  Ginger, Peach and Turmeric   Yes [provider]  NON FORMULARY Take 6 oz by mouth at bedtime. Lavendar Chamomile + probiotics   Yes [provider]  NON FORMULARY Take 11 oz by mouth daily. Protein Shake with vitamins and minerals   Yes [provider]  rosuvastatin  (CRESTOR ) 5 MG tablet Take 1 tablet (5 mg total) by mouth daily. 01/12/24  Yes Glendia Shad, MD    Allergies as of 07/19/2024 - Review Complete 04/08/2024  Allergen Reaction Noted   Penicillins Rash 02/26/2013    Family History  Problem Relation Age of Onset   Arthritis Mother    Stroke Mother    Hypertension Mother    Alzheimer's disease Mother    Dementia Mother    Colon cancer Father    Prostate cancer Father    Mental illness Sister    Alcohol abuse Brother    Congestive Heart Failure Brother    Cancer - Lung Brother    Colon cancer Paternal Uncle    Colon cancer Maternal Grandmother    Mental illness Maternal Grandmother    Alzheimer's disease Maternal Grandmother    Hyperlipidemia Maternal Grandfather    Hypertension Maternal Grandfather    Diabetes Maternal Grandfather    Arthritis Paternal Grandmother    Alcohol abuse Paternal Grandfather    Breast cancer Neg Hx     Social History   Socioeconomic History   Marital status: Single  Spouse name: Not on file   Number of children: Not on file   Years of education: Not on file   Highest education level: Not on file  Occupational History   Not on file  Tobacco Use   Smoking status: Never   Smokeless tobacco: Never  Vaping Use   Vaping status: Never Used  Substance and Sexual Activity   Alcohol use: Yes    Alcohol/week: 1.0 - 2.0 standard drink of alcohol    Types: 1 - 2 Standard drinks or equivalent per week    Comment: occaisionally   Drug use: Never   Sexual activity: Not Currently  Other Topics Concern   Not on file  Social History Narrative   Not on file   Social Drivers of Health   Tobacco Use: Low Risk  (08/29/2024)   Patient History    Smoking Tobacco Use: Never    Smokeless Tobacco Use: Never    Passive Exposure: Not on file  Financial Resource Strain: Low Risk (01/17/2024)   Overall Financial Resource Strain (CARDIA)    Difficulty of Paying Living Expenses: Not hard at all  Food Insecurity: No Food Insecurity (01/17/2024)   Hunger Vital Sign    Worried About Running Out of Food in the Last Year: Never true    Ran Out of Food in the Last Year: Never true  Transportation Needs: No Transportation Needs (01/17/2024)   PRAPARE - Administrator, Civil Service (Medical): No    Lack of Transportation (Non-Medical): No  Physical Activity: Sufficiently Active (01/17/2024)   Exercise Vital Sign    Days of Exercise per Week: 5 days    Minutes of Exercise per Session: 30 min  Stress: No Stress Concern Present (01/17/2024)   [employer] of Occupational Health - Occupational Stress Questionnaire    Feeling of Stress : Only a little  Social Connections: Moderately Integrated (01/17/2024)   Social Connection and Isolation Panel    Frequency of Communication with Friends and Family: Three times a week    Frequency of Social Gatherings with Friends and Family: Once a week    Attends Religious Services: More than 4 times per year    Active Member of [employer] or Organizations: Yes    Attends Banker Meetings: More than 4 times per year    Marital Status: Divorced  Intimate Partner Violence: Not At Risk (01/17/2024)   Humiliation, Afraid, Rape, and Kick questionnaire    Fear of Current or Ex-Partner: No    Emotionally Abused: No    Physically Abused: No    Sexually Abused: No  Depression (PHQ2-9): Low Risk (02/27/2024)   Depression (PHQ2-9)    PHQ-2 Score: 3  Alcohol Screen: Low Risk (01/17/2024)   Alcohol Screen    Last Alcohol Screening Score (AUDIT): 1  Housing: Unknown (01/17/2024)   Housing Stability Vital Sign    Unable to Pay for Housing in the Last Year: No    Number of  Times Moved in the Last Year: Not on file    Homeless in the Last Year: No  Utilities: Not At Risk (01/17/2024)   [employer] Utilities    Threatened with loss of utilities: No  Health Literacy: Adequate Health Literacy (01/17/2024)   B1300 Health Literacy    Frequency of need for help with medical instructions: Never    Review of Systems: See HPI, otherwise negative ROS  Physical Exam: BP (!) 153/90   Temp 99.5 F (37.5 C) (Temporal)   Resp 12  Ht 5' 2 (1.575 m)   Wt 63.5 kg   SpO2 98%   BMI 25.62 kg/m  General:   Alert, cooperative in NAD Head:  Normocephalic and atraumatic. Respiratory:  Normal work of breathing. Cardiovascular:  RRR  Impression/Plan: Brooke Gallagher is here for cataract surgery LEFT EYE.  Risks, benefits, limitations, and alternatives regarding cataract surgery have been reviewed with the patient.  Questions have been answered.  All parties agreeable.   Curtistine JINNY Fava, MD  08/29/2024, 8:40 AM

## 2024-08-29 NOTE — Anesthesia Preprocedure Evaluation (Addendum)
"                                    Anesthesia Evaluation  Patient identified by MRN, date of birth, ID band Patient awake    Reviewed: Allergy & Precautions, H&P , NPO status , Patient's Chart, lab work & pertinent test results  Airway Mallampati: II  TM Distance: >3 FB Neck ROM: full    Dental no notable dental hx.    Pulmonary neg pulmonary ROS   Pulmonary exam normal        Cardiovascular hypertension, Normal cardiovascular exam     Neuro/Psych negative neurological ROS  negative psych ROS   GI/Hepatic negative GI ROS, Neg liver ROS,,,  Endo/Other  negative endocrine ROS    Renal/GU      Musculoskeletal   Abdominal Normal abdominal exam  (+)   Peds  Hematology negative hematology ROS (+)   Anesthesia Other Findings Past Medical History: No date: Allergy No date: Colon polyp No date: Herpes No date: Hx: UTI (urinary tract infection) No date: Hyperlipidemia No date: Hypertension  Past Surgical History: No date: ABDOMINAL HYSTERECTOMY 1998: BACK SURGERY     Comment:  lower back 04/22/2021: BREAST BIOPSY; Left     Comment:  US  Bx, / ribbon clip, BENIGN MAMMARY PARENCHYMA WITH               MODERATE STROMAL FIBROSIS AND FOCAL FIBROADENOMATOID               CHANGES 01/22/2020: LAPAROSCOPIC BILATERAL SALPINGO OOPHERECTOMY; Bilateral     Comment:  Procedure: LAPAROSCOPIC BILATERAL SALPINGO OOPHORECTOMY;              Surgeon: Mancil Barter, MD;  Location: [facility] ORS;                Service: Gynecology;  Laterality: Bilateral; No date: OOPHORECTOMY 1963: TONSILLECTOMY     Reproductive/Obstetrics negative OB ROS                              Anesthesia Physical Anesthesia Plan  ASA: 2  Anesthesia Plan: MAC   Post-op Pain Management:    Induction:   PONV Risk Score and Plan:   Airway Management Planned: Natural Airway  Additional Equipment:   Intra-op Plan:   Post-operative Plan:   Informed Consent:  I have reviewed the patients History and Physical, chart, labs and discussed the procedure including the risks, benefits and alternatives for the proposed anesthesia with the patient or authorized representative who has indicated his/her understanding and acceptance.       Plan Discussed with: Anesthesiologist, CRNA and Surgeon  Anesthesia Plan Comments:          Anesthesia Quick Evaluation  "

## 2024-08-29 NOTE — Transfer of Care (Signed)
 Immediate Anesthesia Transfer of Care Note  Patient: Brooke Gallagher  Procedure(s) Performed: PHACOEMULSIFICATION, CATARACT, WITH IOL INSERTION 11.03, 01:14.6 (Left)  Patient Location: PACU  Anesthesia Type: MAC  Level of Consciousness: awake, alert  and patient cooperative  Airway and Oxygen Therapy: Patient Spontanous Breathing and Patient connected to supplemental oxygen  Post-op Assessment: Post-op Vital signs reviewed, Patient's Cardiovascular Status Stable, Respiratory Function Stable, Patent Airway and No signs of Nausea or vomiting  Post-op Vital Signs: Reviewed and stable  Complications: No notable events documented.

## 2024-08-29 NOTE — Op Note (Signed)
 PREOPERATIVE DIAGNOSIS:  Nuclear sclerotic cataract of the left eye.   POSTOPERATIVE DIAGNOSIS:  Nuclear sclerotic cataract of the left eye.   OPERATIVE PROCEDURE: Phacoemulsification with IOL Implant Left Eye   SURGEON:  Curtistine Fava, MD   ANESTHESIA:  Anesthesiologist: Vicci Camellia Glatter, MD CRNA: [place], [place], CRNA  1.      Managed anesthesia care. 2.     0.45ml of epi-Shugarcaine was instilled following the paracentesis   COMPLICATIONS:  None.   TECHNIQUE:   Phacoemulsification divide and conquer   DESCRIPTION OF PROCEDURE:  The patient was examined and consented in the preoperative holding area where the aforementioned topical anesthesia was applied to the left eye and then brought back to the Operating Room where the left eye was prepped and draped in the usual sterile ophthalmic fashion and a lid speculum was placed. A paracentesis was created with the side port blade and the anterior chamber was filled with epi-Shugarcaine follower by viscoelastic. A clear corneal incision was performed with the steel keratome. A continuous curvilinear capsulorrhexis was performed with a cystotome followed by the capsulorrhexis forceps. Hydrodissection and hydrodelineation were carried out with BSS on a blunt cannula. The lens was removed in a divide and conquer technique and the remaining cortical material was removed with the irrigation-aspiration handpiece. The capsular bag was inflated with viscoelastic and the DIB00 +18.5D lens was placed in the capsular bag without complication. The remaining viscoelastic was removed from the eye with the irrigation-aspiration handpiece. The wounds were hydrated. The anterior chamber was flushed with BSS and the eye was inflated to physiologic pressure. 0.1ml of Vigamox  was placed in the anterior chamber. The wounds were found to be water tight. The eye was dressed with Combigan  and covered with a clear shield to be worn until the first postoperative day  appointment. The patient was given protective glasses to wear throughout the day. The patient was also given drops with which to begin a drop regimen today and will follow-up with me in one day. Implant Name Type Inv. Item Serial No. Manufacturer Lot No. LRB No. Used Action  LENS IOL TECNIS EYHANCE 18.5 - [id number] Intraocular Lens LENS IOL TECNIS EYHANCE 18.5 [id number] [employer]  Left 1 Implanted    Procedures: PHACOEMULSIFICATION, CATARACT, WITH IOL INSERTION 11.03, 01:14.6 (Left)  Electronically signed: Curtistine PARAS [facility] 08/29/2024 9:18 AM

## 2024-09-16 ENCOUNTER — Ambulatory Visit: Admitting: Internal Medicine

## 2024-09-16 ENCOUNTER — Ambulatory Visit

## 2024-11-07 ENCOUNTER — Ambulatory Visit: Admitting: Internal Medicine

## 2025-01-21 ENCOUNTER — Ambulatory Visit
